# Patient Record
Sex: Male | Born: 1980 | Race: White | Hispanic: No | Marital: Married | Smoking: Never smoker
Health system: Southern US, Community
[De-identification: ages and names within clinical notes are randomized; demographics above are authoritative.]

## PROBLEM LIST (undated history)

## (undated) DIAGNOSIS — F419 Anxiety disorder, unspecified: Secondary | ICD-10-CM

## (undated) DIAGNOSIS — M25519 Pain in unspecified shoulder: Secondary | ICD-10-CM

## (undated) DIAGNOSIS — M1990 Unspecified osteoarthritis, unspecified site: Secondary | ICD-10-CM

## (undated) DIAGNOSIS — T7840XA Allergy, unspecified, initial encounter: Secondary | ICD-10-CM

## (undated) DIAGNOSIS — G43909 Migraine, unspecified, not intractable, without status migrainosus: Secondary | ICD-10-CM

## (undated) DIAGNOSIS — K635 Polyp of colon: Secondary | ICD-10-CM

## (undated) DIAGNOSIS — R002 Palpitations: Secondary | ICD-10-CM

## (undated) HISTORY — DX: Unspecified osteoarthritis, unspecified site: M19.90

## (undated) HISTORY — DX: Allergy, unspecified, initial encounter: T78.40XA

## (undated) HISTORY — DX: Pain in unspecified shoulder: M25.519

## (undated) HISTORY — DX: Migraine, unspecified, not intractable, without status migrainosus: G43.909

## (undated) HISTORY — DX: Palpitations: R00.2

## (undated) HISTORY — DX: Anxiety disorder, unspecified: F41.9

## (undated) HISTORY — PX: UPPER GASTROINTESTINAL ENDOSCOPY: SHX188

## (undated) HISTORY — DX: Polyp of colon: K63.5

## (undated) HISTORY — PX: COLONOSCOPY: SHX174

---

## 2014-11-17 DIAGNOSIS — G8929 Other chronic pain: Secondary | ICD-10-CM | POA: Insufficient documentation

## 2014-11-17 DIAGNOSIS — R519 Headache, unspecified: Secondary | ICD-10-CM | POA: Insufficient documentation

## 2014-11-23 DIAGNOSIS — G8929 Other chronic pain: Secondary | ICD-10-CM | POA: Insufficient documentation

## 2014-11-23 DIAGNOSIS — M25512 Pain in left shoulder: Secondary | ICD-10-CM

## 2014-11-23 DIAGNOSIS — M19011 Primary osteoarthritis, right shoulder: Secondary | ICD-10-CM | POA: Insufficient documentation

## 2014-11-23 DIAGNOSIS — M19012 Primary osteoarthritis, left shoulder: Secondary | ICD-10-CM | POA: Insufficient documentation

## 2014-11-23 HISTORY — DX: Pain in left shoulder: M25.512

## 2014-11-23 HISTORY — DX: Other chronic pain: G89.29

## 2015-12-13 DIAGNOSIS — R9389 Abnormal findings on diagnostic imaging of other specified body structures: Secondary | ICD-10-CM

## 2015-12-13 HISTORY — DX: Abnormal findings on diagnostic imaging of other specified body structures: R93.89

## 2019-10-31 ENCOUNTER — Other Ambulatory Visit: Payer: Self-pay | Admitting: *Deleted

## 2019-10-31 ENCOUNTER — Other Ambulatory Visit: Payer: 59

## 2019-10-31 DIAGNOSIS — Z20822 Contact with and (suspected) exposure to covid-19: Secondary | ICD-10-CM

## 2019-11-03 LAB — NOVEL CORONAVIRUS, NAA: SARS-CoV-2, NAA: NOT DETECTED

## 2019-11-12 ENCOUNTER — Other Ambulatory Visit: Payer: 59

## 2019-11-12 DIAGNOSIS — Z20822 Contact with and (suspected) exposure to covid-19: Secondary | ICD-10-CM

## 2019-11-13 LAB — NOVEL CORONAVIRUS, NAA: SARS-CoV-2, NAA: NOT DETECTED

## 2019-11-13 LAB — SARS-COV-2, NAA 2 DAY TAT

## 2019-12-01 ENCOUNTER — Other Ambulatory Visit: Payer: Self-pay

## 2019-12-01 ENCOUNTER — Ambulatory Visit: Payer: Managed Care, Other (non HMO) | Admitting: Family

## 2019-12-01 ENCOUNTER — Encounter: Payer: Self-pay | Admitting: Family

## 2019-12-01 VITALS — BP 145/92 | HR 73 | Temp 98.3°F | Ht 69.5 in | Wt 200.6 lb

## 2019-12-01 DIAGNOSIS — R198 Other specified symptoms and signs involving the digestive system and abdomen: Secondary | ICD-10-CM

## 2019-12-01 DIAGNOSIS — R519 Headache, unspecified: Secondary | ICD-10-CM

## 2019-12-01 DIAGNOSIS — Z8619 Personal history of other infectious and parasitic diseases: Secondary | ICD-10-CM

## 2019-12-01 DIAGNOSIS — Z86010 Personal history of colonic polyps: Secondary | ICD-10-CM

## 2019-12-01 DIAGNOSIS — M79661 Pain in right lower leg: Secondary | ICD-10-CM

## 2019-12-01 DIAGNOSIS — D126 Benign neoplasm of colon, unspecified: Secondary | ICD-10-CM | POA: Insufficient documentation

## 2019-12-01 NOTE — Progress Notes (Signed)
Bradley Lawson is a 39 y.o. male with the following history as recorded in Epic[employer]:  Patient Active Problem List   Diagnosis Date Noted  . History of Clostridioides difficile infection 12/01/2019  . Adenomatous colon polyp 12/01/2019  . Chronic nonintractable headache 11/17/2014    Current Outpatient Medications  Medication Sig Dispense Refill  . Cholecalciferol 50 MCG (2000 UT) CAPS Take by mouth.    . loratadine (CLARITIN) 10 MG tablet Take by mouth.    . propranolol ER (INDERAL LA) 160 MG SR capsule Take 1 capsule by mouth at bedtime.    Marland Kitchen zolpidem (AMBIEN) 5 MG tablet Take by mouth.     No current facility-administered medications for this visit.    Allergies: Clindamycin/lincomycin  Past Medical History:  Diagnosis Date  . Migraine headache     History reviewed. No pertinent surgical history.  History reviewed. No pertinent family history.  Social History   Tobacco Use  . Smoking status: Not on file  Substance Use Topics  . Alcohol use: Not on file    Subjective:  Presents today as a new patient; is up to date on CPE- had with provider in [place] earlier this year; recently moved from [place];  Is recovering from right calf strain- taking anywhere from 2-6 tablets Ibuprofen; would like to get ultrasound updated; concerned he may have torn something; Chronic headaches- has been under care of [facility]; would be open to seeing provider in [place];      Objective:  Vitals:   12/01/19 0859  BP: (!) 145/92  Pulse: 73  Temp: 98.3 F (36.8 C)  TempSrc: Oral  SpO2: 98%  Weight: 200 lb 9.6 oz (91 kg)  Height: 5' 9.5" (1.765 m)    General: Well developed, well nourished, in no acute distress  Skin : Warm and dry.  Head: Normocephalic and atraumatic  Eyes: Sclera and conjunctiva clear; pupils round and reactive to light; extraocular movements intact  Ears: External normal; canals clear; tympanic membranes normal  Oropharynx: Pink, supple. No suspicious  lesions  Neck: Supple without thyromegaly, adenopathy  Lungs: Respirations unlabored; clear to auscultation bilaterally without wheeze, rales, rhonchi  CVS exam: normal rate and regular rhythm.  Abdomen: Soft; nontender; nondistended; normoactive bowel sounds; no masses or hepatosplenomegaly  Musculoskeletal: No deformities; no active joint inflammation; mild swelling noted in right calf Extremities: No edema, cyanosis, clubbing  Vessels: Symmetric bilaterally  Neurologic: Alert and oriented; speech intact; face symmetrical; moves all extremities well; CNII-XII intact without focal deficit   Assessment:  1. Change in bowel movement   2. History of Clostridioides difficile infection   3. History of adenomatous polyp of colon   4. Chronic daily headache     Plan:  Refer to GI and neurology to establish care with provider in [place]; He will schedule with sports medicine today regarding right calf pain;  This visit occurred during the SARS-CoV-2 public health emergency.  Safety protocols were in place, including screening questions prior to the visit, additional usage of staff PPE, and extensive cleaning of exam room while observing appropriate contact time as indicated for disinfecting solutions.   Spent 30 minutes with patient reviewing history/ discussing treatment plans;   Return for CPE after 04/02/2019.  Orders Placed This Encounter  Procedures  . Ambulatory referral to Gastroenterology    Referral Priority:   Routine    Referral Type:   Consultation    Referral Reason:   Specialty Services Required    Number of Visits Requested:  1  . Ambulatory referral to Neurology    Referral Priority:   Routine    Referral Type:   Consultation    Referral Reason:   Specialty Services Required    Referred to Provider:   Pieter Partridge, DO    Requested Specialty:   Neurology    Number of Visits Requested:   1    Requested Prescriptions    No prescriptions requested or ordered in this  encounter

## 2019-12-03 ENCOUNTER — Other Ambulatory Visit: Payer: Self-pay

## 2019-12-03 ENCOUNTER — Encounter: Payer: Self-pay | Admitting: Family Medicine

## 2019-12-03 ENCOUNTER — Ambulatory Visit: Payer: Managed Care, Other (non HMO) | Admitting: Family Medicine

## 2019-12-03 ENCOUNTER — Ambulatory Visit: Payer: Self-pay

## 2019-12-03 VITALS — BP 120/78 | HR 63 | Ht 69.5 in | Wt 202.4 lb

## 2019-12-03 DIAGNOSIS — S86111A Strain of other muscle(s) and tendon(s) of posterior muscle group at lower leg level, right leg, initial encounter: Secondary | ICD-10-CM | POA: Diagnosis not present

## 2019-12-03 DIAGNOSIS — T792XXA Traumatic secondary and recurrent hemorrhage and seroma, initial encounter: Secondary | ICD-10-CM

## 2019-12-03 DIAGNOSIS — M79661 Pain in right lower leg: Secondary | ICD-10-CM | POA: Diagnosis not present

## 2019-12-03 NOTE — Patient Instructions (Signed)
Thank you for coming in today.  Please use voltaren gel up to 4x daily for pain as needed.   I recommend you otained a compression sleeve to help with your joint problems. There are many options on the market however I recommend obtaining a full calf Body Helix compression sleeve.  You can find information (including how to appropriate measure yourself for sizing) can be found at www.Body http://www.lambert.com/.  Many of these products are health savings account (HSA) eligible.   You can use the compression sleeve at any time throughout the day but is most important to use while being active as well as for 2 hours post-activity.   It is appropriate to ice following activity with the compression sleeve in place.  Do the exercises we discussed.  Go from up to down slowly.   Recheck in 3 weeks.   If not better we can drain the fluid.    Seroma A seroma is a collection of fluid on the body that looks like swelling or a mass. Seromas form where tissue has been injured or cut. Seromas vary in size. Some are small and painless. Others may become large and cause pain or discomfort. Many seromas go away on their own as the fluid is naturally absorbed by the body, and some seromas need to be drained. What are the causes? Seromas form as the result of damage to tissue or the removal of tissue. This tissue damage may occur during surgery or because of an injury or trauma. When tissue is disrupted or removed, empty space is created. The body's natural defense system (immune system) causes fluid to enter the empty space and form a seroma. What are the signs or symptoms? Symptoms of this condition include:  Swelling at the site of a surgical cut (incision) or an injury.  Drainage of clear fluid at the surgery or injury site.  Discomfort or pain. How is this diagnosed? This condition is diagnosed based on your symptoms, your medical history, and a physical exam. During the exam, your health care provider will press  on the seroma. You may also have tests, including:  Blood tests.  Imaging tests, such as an ultrasound or CT scan. How is this treated? Some seromas go away (resolve) on their own. Your health care provider may monitor you to make sure the seroma does not cause any complications. If your seroma does not resolve on its own, treatment may include:  Using a needle to drain the fluid from the seroma (needle aspiration).  Inserting a flexible tube (catheter) to drain the fluid.  Applying a bandage (dressing), such as an elastic bandage or binder.  Antibiotic medicines, if the seroma becomes infected. In rare cases, surgery may be done to remove the seroma and repair the area. Follow these instructions at home:   If you were prescribed an antibiotic medicine, take it as told by your health care provider. Do not stop taking the antibiotic even if you start to feel better.  Return to your normal activities as told by your health care provider. Ask your health care provider what activities are safe for you.  Take over-the-counter and prescription medicines only as told by your health care provider.  Check your seroma every day for signs of infection. Check for: ? Redness or pain. ? Fluid or pus. ? More swelling. ? Warmth.  Keep all follow-up visits as told by your health care provider. This is important. Contact a health care provider if:  You have a  fever.  You have redness or pain at the site of the seroma.  You have fluid or pus coming from the seroma.  Your seroma is more swollen or is getting bigger.  Your seroma is warm to the touch. This information is not intended to replace advice given to you by your health care provider. Make sure you discuss any questions you have with your health care provider. Document Revised: 01/12/2017 Document Reviewed: 11/12/2015 Elsevier Patient Education  2020 [employer].     Medial Head Gastrocnemius Tear Rehab Ask your health care  provider which exercises are safe for you. Do exercises exactly as told by your health care provider and adjust them as directed. It is normal to feel mild stretching, pulling, tightness, or discomfort as you do these exercises. Stop right away if you feel sudden pain or your pain gets worse. Do not begin these exercises until told by your health care provider. Stretching and range-of-motion exercises These exercises warm up your muscles and joints and improve the movement and flexibility of your lower leg. These exercises also help to relieve pain and stiffness. Gastrocnemius stretch This exercise is also called a calf stretch. It stretches the muscles in the back of the lower leg (gastrocnemius). 1. Sit with your left / right leg extended. 2. Loop a belt or towel around the ball of your left / right foot. The ball of your foot is on the walking surface, right under your toes. 3. Hold both ends of the belt or towel. 4. Keep your left / right ankle and foot relaxed and keep your knee straight while you use the belt or towel to pull your foot and ankle toward you. Stop at the first point of resistance. 5. Hold this position for __________ seconds. Repeat __________ times. Complete this exercise __________ times a day. Ankle alphabet  1. Sit with your left / right leg supported at the lower leg. ? Do not rest your foot on anything. ? Make sure your foot has room to move freely. 2. Think of your left / right foot as a paintbrush. ? Move your foot to trace each letter of the alphabet in the air. Keep your hip and knee still while you trace. ? Make the letters as large as you can without feeling discomfort. 3. Trace every letter of the alphabet. Repeat __________ times. Complete this exercise __________ times a day. Strengthening exercises These exercises build strength and endurance in your lower leg. Endurance is the ability to use your muscles for a long time, even after they get tired. Plantar  flexion with band, seated  1. Sit on the floor with your left / right leg extended. 2. Loop a rubber exercise band or tube around the ball of your left / right foot. The ball of your foot is on the walking surface, right under your toes. The band or tube should be slightly tense when your foot is relaxed. If the band or tube slips, you can put on your shoe or put a washcloth between the band and your foot to help it stay in place. 3. While holding both ends of the band or tube, slowly point your toes downward, pushing them away from you (plantar flexion). 4. Hold this position for __________ seconds. 5. Slowly release the tension in the band or tube, controlling smoothly until your foot is back to the starting position. Repeat __________ times. Complete this exercise __________ times a day. Plantar flexion, standing  1. Stand with your feet  shoulder-width apart. 2. Place your hands on a wall or table to steady yourself as needed, but try not to use it for support. 3. Rise up on your toes (plantar flexion). 4. If this exercise is too easy, try these options: ? Shift your weight toward your left / right leg until you feel challenged. ? If told by your health care provider, stand on your left / right foot only. 5. Hold this position for __________ seconds. Repeat __________ times. Complete this exercise __________ times a day. Eccentric plantar flexion  1. Stand on the balls of your feet on the edge of a step. The ball of your foot is on the walking surface, right under your toes. ? Do not put your heels on the step. ? For balance, rest your hands on the wall or on a railing. Try not to lean on it for support. 2. Rise up onto the balls of your feet, using both legs to help. 3. Keeping your heels up, slowly shift all of your weight to your left / right foot and lift your other foot off the step. 4. Slowly lower your left / right heel so it drops below the level of the step. Lowering your heel  under tension is called eccentric plantar flexion. You will feel a slight stretch in your left / right calf. 5. Put your other foot back onto the step before returning to the start position. Repeat __________ times. Complete this exercise __________ times a day. This information is not intended to replace advice given to you by your health care provider. Make sure you discuss any questions you have with your health care provider. Document Revised: 11/27/2018 Document Reviewed: 12/10/2017 Elsevier Patient Education  2020 [employer].

## 2019-12-03 NOTE — Progress Notes (Signed)
   Subjective:    I'm seeing this patient as a consultation for:  Jodi Mourning, FNP. Note will be routed back to referring provider/PCP.  CC: R calf strain  I, Molly Weber, LAT, ATC, am serving as scribe for Dr. Lynne Leader.  HPI: Pt is a 39 y/o male presenting w/ c/o R calf pain x approximately one month when he was backpedaling while playing tennis.  He states that he felt like he was hit in the calf w/ a baseball bat.  He was improving until last week when he tripped and stepped awkwardly while he was hiking.  He locates his pain to his R medial gastroc.  R calf swelling: yes Aggravating factors: walking; standing straight up w/ his R foot flat on the floor; R ankle DF AROM Treatments tried: IBU;   Past medical history, Surgical history, Family history, Social history, Allergies, and medications have been entered into the medical record, reviewed.   Review of Systems: No new headache, visual changes, nausea, vomiting, diarrhea, constipation, dizziness, abdominal pain, skin rash, fevers, chills, night sweats, weight loss, swollen lymph nodes, body aches, joint swelling, muscle aches, chest pain, shortness of breath, mood changes, visual or auditory hallucinations.   Objective:    Vitals:   12/03/19 1312  BP: 120/78  Pulse: 63  SpO2: 97%   General: Well Developed, well nourished, and in no acute distress.  Neuro/Psych: Alert and oriented x3, extra-ocular muscles intact, able to move all 4 extremities, sensation grossly intact. Skin: Warm and dry, no rashes noted.  Respiratory: Not using accessory muscles, speaking in full sentences, trachea midline.  Cardiovascular: Pulses palpable, no extremity edema. Abdomen: Does not appear distended. MSK: Right calf large medial gastrocnemius otherwise normal. Normal foot and ankle motion and knee motion. Tender palpation medial head gastrocnemius. Achilles tendon nontender with no defects. Pulses capillary fill and sensation intact  distally.  Antalgic gait.  Lab and Radiology Results Diagnostic Limited MSK Ultrasound of: Right calf Achilles tendon intact normal-appearing Medial head gastrocnemius abnormal appearing with large hypoechoic structure deep to gastrocnemius superficial to the soleus consistent with seroma. Minimal retraction muscle tendinous junction medial gastrocnemius. Impression: Medial head gastrocnemius muscle tear with seroma/hematoma   Impression and Recommendations:    Assessment and Plan: 39 y.o. male with right medial head gastrocnemius tear occurring about a month ago with repeat injury recently.  Patient also has a large seroma or hematoma deep to the gastrocnemius at the medial head.  Discussed treatment plans and options.  Plan for Voltaren gel and eccentric exercises and compression.  Recheck back in 3 weeks or so.  Will reassess seroma at that point and if not better consider aspiration and injection.  Patient notes headache history and would like to avoid nitroglycerin patches if possible.Marland Kitchen  PDMP not reviewed this encounter. Orders Placed This Encounter  Procedures  . [place] LIMITED JOINT SPACE STRUCTURES LOW RIGHT(NO LINKED CHARGES)    Order Specific Question:   Reason for Exam (SYMPTOM  OR DIAGNOSIS REQUIRED)    Answer:   R calf pain    Order Specific Question:   Preferred imaging location?    Answer:   [place]   No orders of the defined types were placed in this encounter.   Discussed warning signs or symptoms. Please see discharge instructions. Patient expresses understanding.   The above documentation has been reviewed and is accurate and complete Lynne Leader, M.D.

## 2019-12-15 ENCOUNTER — Encounter: Payer: Self-pay | Admitting: Neurology

## 2019-12-15 ENCOUNTER — Other Ambulatory Visit: Payer: Self-pay | Admitting: Family

## 2019-12-15 DIAGNOSIS — G8929 Other chronic pain: Secondary | ICD-10-CM

## 2019-12-15 DIAGNOSIS — R519 Headache, unspecified: Secondary | ICD-10-CM

## 2019-12-23 NOTE — Progress Notes (Signed)
   I, Wendy Poet, LAT, ATC, am serving as scribe for Dr. Lynne Leader.  Bradley Lawson is a 39 y.o. male who presents to [place] at [facility] today for f/u of R medial gastroc pain.  He was last seen by Dr. Georgina Snell on 12/03/19 and he was advised to use a calf compression sleeve and was shown a HEP consisting of Alfredson's exercises by Dr. Georgina Snell.  Since his last visit, pt reports that his R medial gastroc is improving to the point that he was able to do some limited jogging w/ good results.  He notes that his biggest issue is w/ calf tightness in the morning when he first gets out of bed.  He has been intermittently wearing a calf compression sleeve and doing his Alfredson's exercises approximately every other day.   Pertinent review of systems: No fevers or chills  Relevant historical information: History of colon polyp and C. difficile infection   Exam:  BP 102/72 (BP Location: Left Arm, Patient Position: Sitting, Cuff Size: Large)   Pulse (!) 53   Ht 5' 9.5" (1.765 m)   Wt 204 lb 6.4 oz (92.7 kg)   SpO2 98%   BMI 29.75 kg/m  General: Well Developed, well nourished, and in no acute distress.   MSK: Right calf normal-appearing nontender normal motion normal strength.    Lab and Radiology Results  Diagnostic Limited MSK Ultrasound of: Right gastrocnemius medial head Small seroma still visible deep to distal medial head gastrocnemius near muscle tendinous junction. It measures 1 x 5 x 3 cm Impression: Small resolving seroma     Assessment and Plan: 39 y.o. male with resolving seroma right medial gastrocnemius.  Clinically improving as well.  Discussed options.  Plan to give this a bit more time.  Emphasized the importance of compression.  Recheck in a month if still significantly present we will proceed with aspiration and injection however I am optimistic this will improve on its own.   PDMP not reviewed this encounter. Orders Placed This Encounter    Procedures  . [place] LIMITED JOINT SPACE STRUCTURES LOW RIGHT(NO LINKED CHARGES)    Order Specific Question:   Reason for Exam (SYMPTOM  OR DIAGNOSIS REQUIRED)    Answer:   R gastroc strain    Order Specific Question:   Preferred imaging location?    Answer:   [place]   No orders of the defined types were placed in this encounter.    Discussed warning signs or symptoms. Please see discharge instructions. Patient expresses understanding.   The above documentation has been reviewed and is accurate and complete Lynne Leader, M.D.

## 2019-12-24 ENCOUNTER — Other Ambulatory Visit: Payer: Self-pay

## 2019-12-24 ENCOUNTER — Ambulatory Visit: Payer: Self-pay

## 2019-12-24 ENCOUNTER — Encounter: Payer: Self-pay | Admitting: Family Medicine

## 2019-12-24 ENCOUNTER — Ambulatory Visit: Payer: Managed Care, Other (non HMO) | Admitting: Family Medicine

## 2019-12-24 VITALS — BP 102/72 | HR 53 | Ht 69.5 in | Wt 204.4 lb

## 2019-12-24 DIAGNOSIS — M79661 Pain in right lower leg: Secondary | ICD-10-CM

## 2019-12-24 DIAGNOSIS — T792XXD Traumatic secondary and recurrent hemorrhage and seroma, subsequent encounter: Secondary | ICD-10-CM

## 2019-12-24 NOTE — Patient Instructions (Signed)
Thank you for coming in today.  It is improving both clinically and on ultrasound.   Use the compression as much as possible.   Ok to advance activity as tolerated.   Recheck in a month.  If not significantly better will aspirate the fluid.

## 2020-01-30 NOTE — Progress Notes (Signed)
   I, Bradley Lawson, LAT, ATC, am serving as scribe for Dr. Lynne Leader.  Bradley Lawson is a 39 y.o. male who presents to [place] at [facility] today for f/u of R medial calf/gastroc pain.  He was last seen by Dr. Georgina Snell on 12/24/19 for f/u and noted overall improvement w/ his symptoms to the point that he had resumed limited jogging.  He was advised to con't using/wearing his calf compression sleeve and to con't his Alfredson's exercise as well as to advance activity as tolerated.  Since his last visit, pt reports improvement in calf pain. Pt has been jogging intermittently. Pt notes an improvement in anxiety related to the injury. Pt states that there is one area of tenderness towards the distal junction of the gastroc/soleus complex.   Pertinent review of systems: No fevers or chills  Relevant historical information: Migraine headaches   Exam:  BP 118/80   Pulse 69   Ht 5' 9.5" (1.765 m)   Wt 209 lb (94.8 kg)   SpO2 98%   BMI 30.42 kg/m  General: Well Developed, well nourished, and in no acute distress.   MSK: Right calf large but otherwise normal-appearing.  No swelling or bruising. Normal foot and ankle motion. Mildly tender at medial gastrocnemius muscle tendinous junction. Strength is intact.     Lab and Radiology Results  Diagnostic Limited MSK Ultrasound of: Right gastrocnemius medial head Continued visible disruption of muscle fibers of muscle tendinous junction however no significant remaining seroma.  Scar tissue formation with linear parallel lines visible indicating good healing response.  Not much vascular activity seen on Doppler indicating good healing as well. Impression: Healing gastrocnemius tear     Assessment and Plan: 39 y.o. male with right gastrocnemius tear occurring in September.  Significantly improving with conservative management.  Plan to continue compression and eccentric exercises.  He normally would be a good candidate  for nitroglycerin patch protocol but he has a history of headache disorder and is currently on propranolol daily for prophylaxis.  I think at this point the risk of propranolol would exceed the benefit. Recheck back as needed.   PDMP not reviewed this encounter. Orders Placed This Encounter  Procedures  . [place] LIMITED JOINT SPACE STRUCTURES LOW RIGHT(NO LINKED CHARGES)    Standing Status:   Future    Number of Occurrences:   1    Standing Expiration Date:   08/02/2020    Order Specific Question:   Reason for Exam (SYMPTOM  OR DIAGNOSIS REQUIRED)    Answer:   right knee pain    Order Specific Question:   Preferred imaging location?    Answer:   [place]   No orders of the defined types were placed in this encounter.    Discussed warning signs or symptoms. Please see discharge instructions. Patient expresses understanding.   The above documentation has been reviewed and is accurate and complete Lynne Leader, M.D.

## 2020-02-02 ENCOUNTER — Ambulatory Visit: Payer: Managed Care, Other (non HMO) | Admitting: Family Medicine

## 2020-02-02 ENCOUNTER — Ambulatory Visit: Payer: Self-pay

## 2020-02-02 ENCOUNTER — Other Ambulatory Visit: Payer: Self-pay

## 2020-02-02 VITALS — BP 118/80 | HR 69 | Ht 69.5 in | Wt 209.0 lb

## 2020-02-02 DIAGNOSIS — S86111D Strain of other muscle(s) and tendon(s) of posterior muscle group at lower leg level, right leg, subsequent encounter: Secondary | ICD-10-CM

## 2020-02-02 DIAGNOSIS — M79661 Pain in right lower leg: Secondary | ICD-10-CM

## 2020-02-02 NOTE — Patient Instructions (Signed)
Thank you for coming in today.   Plan to continue to advance exercise as tolerated.   Continue the home exercises and compression.   If not feeling better let me know.  We may want to try the nitro patches then even with your headache history.   Avoid blastic activity for another month or so.

## 2020-02-16 ENCOUNTER — Ambulatory Visit: Payer: Managed Care, Other (non HMO) | Admitting: Gastroenterology

## 2020-02-25 NOTE — Progress Notes (Signed)
NEUROLOGY CONSULTATION NOTE  Bradley Lawson MRN: [id number] DOB: Dec 18, 1980  Referring provider: Marrian Lawson, [place] Primary care provider: Marrian Salvage, FNP  Reason for consult:  headaches   Subjective:  Bradley Lawson is a 40 year old right-handed male who presents for headaches.  History supplemented by prior neurologist's and referring provider's notes.  He reports history of migraines since childhood.  He has been on propranolol for many years but recently doesn't think it has been effective.  They are moderate bifrontal pressure ache.  Some photophobia and phonophobia.  No nausea, vomiting, visual disturbance, numbness and weakness. They last 3-4 hours.  They occur every other day.  Takes ibuprofen 2 to 3 tablets almost daily.  Triggers include noise, certain smells (perfumes, flowers), stress and sometimes exertion (lifting weights).  Applying pressure on the temples help relieve it.    Previous imaging: 01/23/2013 MRI BRAIN W WO:  Normal study 06/11/2017 MRI BRAIN/PITUITARY W WO:  Normal MRI of the brain and pituitary  Current NSAIDS/analgesics:  ibuprofen Current triptans:  none Current ergotamine:  none Current anti-emetic:  none Current muscle relaxants:  none Current Antihypertensive medications:  Propranolol ER 160mg  Current Antidepressant medications:  none Current Anticonvulsant medications:  none Current anti-CGRP:  none Current Vitamins/Herbal/Supplements:  none Current Antihistamines/Decongestants:  Claritin Other therapy:  none Hormone/birth control:  none Other medications:  Ambien  Past NSAIDS/analgesics:  Tylenol, Excedrin Past abortive triptans:  Sumatriptan, Zomig-MLT Past abortive ergotamine:  none Past muscle relaxants:  none Past anti-emetic:  none Past antihypertensive medications:  none Past antidepressant medications:  none Past anticonvulsant medications:  topiramate (worsened tinnitus), zonisamide Past anti-CGRP:   none Past vitamins/Herbal/Supplements:  none Past antihistamines/decongestants:  Flonase Other past therapies:  none  Caffeine:  2 cups of coffee daily.  1 can Coke Zero daily Diet:  36 oz water daily.  Does not skip meals Exercise:  Routine.  Lifts weights Depression:  no; Anxiety:  A little bit Other pain:  no Sleep:  good Family history of headache:  Father, daughter   PAST MEDICAL HISTORY: Past Medical History:  Diagnosis Date  . Colon polyps   . Migraine headache   . Osteoarthritis    shoulder  . Shoulder pain    left    PAST SURGICAL HISTORY: No past surgical history on file.  MEDICATIONS: Current Outpatient Medications on File Prior to Visit  Medication Sig Dispense Refill  . Cholecalciferol 50 MCG (2000 UT) CAPS Take by mouth.    . loratadine (CLARITIN) 10 MG tablet Take by mouth.    . propranolol ER (INDERAL LA) 160 MG SR capsule Take 1 capsule by mouth at bedtime.    Marland Kitchen zolpidem (AMBIEN) 5 MG tablet Take by mouth.     No current facility-administered medications on file prior to visit.    ALLERGIES: Allergies  Allergen Reactions  . Clindamycin/Lincomycin   . Sulfa Antibiotics     FAMILY HISTORY: Family History  Problem Relation Age of Onset  . Irritable bowel syndrome Mother   . Anemia Mother   . Diverticulosis Father   . Irritable bowel syndrome Brother   . Stomach cancer Maternal Grandfather   . Heart disease Maternal Grandfather   . Heart disease Paternal Grandmother        CAD    SOCIAL HISTORY: Social History   Socioeconomic History  . Marital status: Married    Spouse name: Not on file  . Number of children: Not on file  . Years of education: Not  on file  . Highest education level: Not on file  Occupational History  . Not on file  Tobacco Use  . Smoking status: Never Smoker  . Smokeless tobacco: Never Used  Substance and Sexual Activity  . Alcohol use: Not on file    Comment: 2-3 on weekends  . Drug use: Never  . Sexual  activity: Yes    Partners: Female  Other Topics Concern  . Not on file  Social History Narrative  . Not on file   Social Determinants of Health   Financial Resource Strain: Not on file  Food Insecurity: Not on file  Transportation Needs: Not on file  Physical Activity: Not on file  Stress: Not on file  Social Connections: Not on file  Intimate Partner Violence: Not on file    Objective:  Blood pressure 130/74, pulse 67, resp. rate 20, height 5' 9.5" (1.765 m), weight 212 lb (96.2 kg), SpO2 99 %. General: No acute distress.  Patient appears well-groomed.   Head:  Normocephalic/atraumatic Eyes:  fundi examined but not visualized Neck: supple, no paraspinal tenderness, full range of motion Back: No paraspinal tenderness Heart: regular rate and rhythm Lungs: Clear to auscultation bilaterally. Vascular: No carotid bruits. Neurological Exam: Mental status: alert and oriented to person, place, and time, recent and remote memory intact, fund of knowledge intact, attention and concentration intact, speech fluent and not dysarthric, language intact. Cranial nerves: CN I: not tested CN II: pupils equal, round and reactive to light, visual fields intact CN III, IV, VI:  full range of motion, no nystagmus, no ptosis CN V: facial sensation intact. CN VII: upper and lower face symmetric CN VIII: hearing intact CN IX, X: gag intact, uvula midline CN XI: sternocleidomastoid and trapezius muscles intact CN XII: tongue midline Bulk & Tone: normal, no fasciculations. Motor:  muscle strength 5/5 throughout Sensation:  Pinprick, temperature and vibratory sensation intact. Deep Tendon Reflexes:  2+ throughout,  toes downgoing.   Finger to nose testing:  Without dysmetria.   Heel to shin:  Without dysmetria.   Gait:  Normal station and stride.  Romberg negative.  Assessment/Plan:   Probable chronic migraine without aura, without status migrainosus, not intractable, complicated by  medication-overuse  1.  Migraine prevention:  Taper off of propranolol.  Start Qulipta 60mg  daily 2.  Migraine rescue:  Stop ibuprofen.  Start Ubrelvy 100mg  3.  Limit use of pain relievers to no more than 2 days out of week to prevent risk of rebound or medication-overuse headache. 4.  Keep headache diary 5.  Caffeine cessation 6.  Follow up in 6 months.    Thank you for allowing me to take part in the care of this patient.  Metta Clines, DO  CC:  Bradley Lawson, [place]

## 2020-02-26 ENCOUNTER — Encounter: Payer: Self-pay | Admitting: Neurology

## 2020-02-26 ENCOUNTER — Ambulatory Visit (INDEPENDENT_AMBULATORY_CARE_PROVIDER_SITE_OTHER): Payer: Managed Care, Other (non HMO) | Admitting: Neurology

## 2020-02-26 ENCOUNTER — Other Ambulatory Visit: Payer: Self-pay

## 2020-02-26 VITALS — BP 130/74 | HR 67 | Resp 20 | Ht 69.5 in | Wt 212.0 lb

## 2020-02-26 DIAGNOSIS — G43709 Chronic migraine without aura, not intractable, without status migrainosus: Secondary | ICD-10-CM

## 2020-02-26 MED ORDER — UBRELVY 100 MG PO TABS: 1.0000 | ORAL_TABLET | ORAL | 5 refills | Status: DC | PRN

## 2020-02-26 MED ORDER — QULIPTA 60 MG PO TABS: 60.0000 mg | ORAL_TABLET | Freq: Every day | ORAL | 5 refills | Status: DC

## 2020-02-26 MED ORDER — PROPRANOLOL HCL ER 80 MG PO CP24: 80.0000 mg | ORAL_CAPSULE | Freq: Every day | ORAL | 0 refills | Status: DC

## 2020-02-26 NOTE — Patient Instructions (Signed)
  1. Take propranolol ER 80mg  daily for one week then stop 2. Start Qulipta 60mg  daily.   3. Stop ibuprofen 4. Take Ubrelvy 100mg  at earliest onset of headache.  May repeat dose once in 2 hours if needed.  Maximum 2 tablets in 24 hours. 5. Limit use of pain relievers to no more than 2 days out of the week.  These medications include acetaminophen, NSAIDs (ibuprofen/Advil/Motrin, naproxen/Aleve, triptans (Imitrex/sumatriptan), Excedrin, and narcotics.  This will help reduce risk of rebound headaches. 6. Be aware of common food triggers:  - Caffeine:  coffee, black tea, cola, Mt. Dew  - Chocolate  - Dairy:  aged cheeses (brie, blue, cheddar, gouda, [place], provolone, [place], Swiss, etc), chocolate milk, buttermilk, sour cream, limit eggs and yogurt  - Nuts, peanut butter  - Alcohol  - Cereals/grains:  FRESH breads (fresh bagels, sourdough, doughnuts), yeast productions  - Processed/canned/aged/cured meats (pre-packaged deli meats, hotdogs)  - MSG/glutamate:  soy sauce, flavor enhancer, pickled/preserved/marinated foods  - Sweeteners:  aspartame (Equal, Nutrasweet).  Sugar and Splenda are okay  - Vegetables:  legumes (lima beans, lentils, snow peas, fava beans, pinto peans, peas, garbanzo beans), sauerkraut, onions, olives, pickles  - Fruit:  avocados, bananas, citrus fruit (orange, lemon, grapefruit), mango  - Other:  Frozen meals, macaroni and cheese 7. Routine exercise 8. Stay adequately hydrated (aim for 64 oz water daily) 9. Keep headache diary 10. Maintain proper stress management 11. Maintain proper sleep hygiene 12. Do not skip meals 13. Consider supplements:  magnesium citrate 400mg  daily, riboflavin 400mg  daily, coenzyme Q10 100mg  three times daily.

## 2020-03-12 ENCOUNTER — Other Ambulatory Visit: Payer: Self-pay

## 2020-03-12 MED ORDER — PROPRANOLOL HCL 60 MG PO TABS: ORAL_TABLET | ORAL | 0 refills | Status: DC

## 2020-03-12 NOTE — Progress Notes (Signed)
Per DR.Jaffe, prescribe him propranolol 60mg  tablet (immediate release, NOT ER) and wean off more slowly: - take 1 tablet twice daily for one week - then 1/2 tablet in morning and 1 tablet at night for one week - then 1/2 tablet twice daily for one week - then stop.

## 2020-03-12 NOTE — Telephone Encounter (Signed)
I don't think it is the [place] but it may be propranolol withdrawal.  I would like to prescribe him propranolol 60mg  tablet (immediate release, NOT ER) and wean off more slowly: - take 1 tablet twice daily for one week - then 1/2 tablet in morning and 1 tablet at night for one week - then 1/2 tablet twice daily for one week - then stop.

## 2020-04-02 ENCOUNTER — Encounter: Payer: Managed Care, Other (non HMO) | Admitting: Family

## 2020-04-12 ENCOUNTER — Encounter: Payer: Self-pay | Admitting: Family

## 2020-04-12 ENCOUNTER — Ambulatory Visit (INDEPENDENT_AMBULATORY_CARE_PROVIDER_SITE_OTHER): Payer: Managed Care, Other (non HMO) | Admitting: Family

## 2020-04-12 ENCOUNTER — Other Ambulatory Visit: Payer: Self-pay

## 2020-04-12 VITALS — BP 132/80 | HR 79 | Temp 98.2°F | Ht 71.0 in | Wt 208.8 lb

## 2020-04-12 DIAGNOSIS — D126 Benign neoplasm of colon, unspecified: Secondary | ICD-10-CM | POA: Diagnosis not present

## 2020-04-12 DIAGNOSIS — Z0000 Encounter for general adult medical examination without abnormal findings: Secondary | ICD-10-CM | POA: Diagnosis not present

## 2020-04-12 DIAGNOSIS — Z13220 Encounter for screening for lipoid disorders: Secondary | ICD-10-CM | POA: Diagnosis not present

## 2020-04-12 DIAGNOSIS — Z8619 Personal history of other infectious and parasitic diseases: Secondary | ICD-10-CM

## 2020-04-12 LAB — COMPREHENSIVE METABOLIC PANEL
ALT: 25 U/L (ref 0–53)
AST: 20 U/L (ref 0–37)
Albumin: 4.5 g/dL (ref 3.5–5.2)
Alkaline Phosphatase: 41 U/L (ref 39–117)
BUN: 12 mg/dL (ref 6–23)
CO2: 27 mEq/L (ref 19–32)
Calcium: 9.5 mg/dL (ref 8.4–10.5)
Chloride: 103 mEq/L (ref 96–112)
Creatinine, Ser: 0.94 mg/dL (ref 0.40–1.50)
GFR: 103.53 mL/min (ref >60.00)
Glucose, Bld: 84 mg/dL (ref 70–99)
Potassium: 4 mEq/L (ref 3.5–5.1)
Sodium: 140 mEq/L (ref 135–145)
Total Bilirubin: 0.7 mg/dL (ref 0.2–1.2)
Total Protein: 7 g/dL (ref 6.0–8.3)

## 2020-04-12 LAB — CBC WITH DIFFERENTIAL/PLATELET
Basophils Absolute: 0 10*3/uL (ref 0.0–0.1)
Basophils Relative: 0.3 % (ref 0.0–3.0)
Eosinophils Absolute: 0.1 10*3/uL (ref 0.0–0.7)
Eosinophils Relative: 1.1 % (ref 0.0–5.0)
HCT: 44 % (ref 39.0–52.0)
Hemoglobin: 15.2 g/dL (ref 13.0–17.0)
Lymphocytes Relative: 31.8 % (ref 12.0–46.0)
Lymphs Abs: 2.2 10*3/uL (ref 0.7–4.0)
MCHC: 34.5 g/dL (ref 30.0–36.0)
MCV: 87.2 fl (ref 78.0–100.0)
Monocytes Absolute: 0.5 10*3/uL (ref 0.1–1.0)
Monocytes Relative: 6.9 % (ref 3.0–12.0)
Neutro Abs: 4.2 10*3/uL (ref 1.4–7.7)
Neutrophils Relative %: 59.9 % (ref 43.0–77.0)
Platelets: 217 10*3/uL (ref 150.0–400.0)
RBC: 5.05 Mil/uL (ref 4.22–5.81)
RDW: 12.6 % (ref 11.5–15.5)
WBC: 7.1 10*3/uL (ref 4.0–10.5)

## 2020-04-12 LAB — LIPID PANEL
Cholesterol: 227 mg/dL — ABNORMAL HIGH (ref 0–200)
HDL: 39.1 mg/dL (ref >39.00)
LDL Cholesterol: 162 mg/dL — ABNORMAL HIGH (ref 0–99)
NonHDL: 187.66
Total CHOL/HDL Ratio: 6
Triglycerides: 130 mg/dL (ref 0.0–149.0)
VLDL: 26 mg/dL (ref 0.0–40.0)

## 2020-04-12 NOTE — Addendum Note (Signed)
Addended by: Jacobo Forest on: 04/12/2020 09:40 AM   Modules accepted: Orders

## 2020-04-12 NOTE — Progress Notes (Signed)
Authur Cubit is a 40 y.o. male with the following history as recorded in [employer]:  Patient Active Problem List   Diagnosis Date Noted  . History of Clostridioides difficile infection 12/01/2019  . Adenomatous colon polyp 12/01/2019  . Chronic nonintractable headache 11/17/2014    Current Outpatient Medications  Medication Sig Dispense Refill  . Atogepant (QULIPTA) 60 MG TABS Take 60 mg by mouth daily. 30 tablet 5  . Cholecalciferol 50 MCG (2000 UT) CAPS Take by mouth.    . clindamycin (CLEOCIN T) 1 % external solution Apply topically.    Marland Kitchen doxycycline (VIBRA-TABS) 100 MG tablet Take 100 mg by mouth 2 (two) times daily.    Marland Kitchen loratadine (CLARITIN) 10 MG tablet Take by mouth.    . Ubrogepant (UBRELVY) 100 MG TABS Take 1 tablet by mouth as needed (May repeat in 2 hours.  Maximum 2 tablets in 24 hours.). 16 tablet 5  . zolpidem (AMBIEN) 5 MG tablet Take by mouth.    . propranolol (INDERAL) 60 MG tablet take 1 tablet twice daily for one week - then 1/2 tablet in morning and 1 tablet at night for one week - then 1/2 tablet twice daily for one week - then stop. (Patient not taking: Reported on 04/12/2020) 60 tablet 0   No current facility-administered medications for this visit.    Allergies: Clindamycin/lincomycin and Sulfa antibiotics  Past Medical History:  Diagnosis Date  . Colon polyps   . Migraine headache   . Osteoarthritis    shoulder  . Shoulder pain    left    History reviewed. No pertinent surgical history.  Family History  Problem Relation Age of Onset  . Irritable bowel syndrome Mother   . Anemia Mother   . Diverticulosis Father   . Irritable bowel syndrome Brother   . Stomach cancer Maternal Grandfather   . Heart disease Maternal Grandfather   . Heart disease Paternal Grandmother        CAD    Social History   Tobacco Use  . Smoking status: Never Smoker  . Smokeless tobacco: Never Used  Substance Use Topics  . Alcohol use: Not on file    Comment: 2-3 on  weekends    Subjective:   Presents for yearly CPE; in baseline state of health; Headaches are much improved; seeing dermatology regularly;  Goal weight is to get below 200- hoping that can get more exercise when springs start;   Review of Systems  Constitutional: Negative.   HENT: Negative.   Eyes: Negative.   Respiratory: Negative.   Cardiovascular: Negative.   Gastrointestinal: Negative.   Genitourinary: Negative.   Musculoskeletal: Negative.   Skin: Negative.   Neurological: Negative.   Endo/Heme/Allergies: Negative.   Psychiatric/Behavioral: Negative.      Objective:  Vitals:   04/12/20 0859  BP: 132/80  Pulse: 79  Temp: 98.2 F (36.8 C)  TempSrc: Oral  SpO2: 98%  Weight: 208 lb 12.8 oz (94.7 kg)  Height: _0  (1.803 m)    General: Well developed, well nourished, in no acute distress  Skin : Warm and dry.  Head: Normocephalic and atraumatic  Eyes: Sclera and conjunctiva clear; pupils round and reactive to light; extraocular movements intact  Ears: External normal; canals clear; tympanic membranes normal  Oropharynx: Pink, supple. No suspicious lesions  Neck: Supple without thyromegaly, adenopathy  Lungs: Respirations unlabored; clear to auscultation bilaterally without wheeze, rales, rhonchi  CVS exam: normal rate and regular rhythm.  Abdomen: Soft; nontender; nondistended; normoactive bowel  sounds; no masses or hepatosplenomegaly  Musculoskeletal: No deformities; no active joint inflammation  Extremities: No edema, cyanosis, clubbing  Vessels: Symmetric bilaterally  Neurologic: Alert and oriented; speech intact; face symmetrical; moves all extremities well; CNII-XII intact without focal deficit   Assessment:  1. PE (physical exam), annual   2. History of Clostridioides difficile infection   3. Adenomatous polyp of colon, unspecified part of colon   4. Lipid screening     Plan:   Age appropriate preventive healthcare needs addressed; encouraged regular  eye doctor and dental exams; encouraged regular exercise; will update labs and refills as needed today; follow-up to be determined;  This visit occurred during the SARS-CoV-2 public health emergency.  Safety protocols were in place, including screening questions prior to the visit, additional usage of staff PPE, and extensive cleaning of exam room while observing appropriate contact time as indicated for disinfecting solutions.     No follow-ups on file.  Orders Placed This Encounter  Procedures  . CBC with Differential/Platelet    Standing Status:   Future    Standing Expiration Date:   04/12/2021  . Comp Met (CMET)    Standing Status:   Future    Standing Expiration Date:   04/12/2021  . Lipid panel    Standing Status:   Future    Standing Expiration Date:   04/12/2021  . Ambulatory referral to Gastroenterology    Referral Priority:   Routine    Referral Type:   Consultation    Referral Reason:   Specialty Services Required    Number of Visits Requested:   1    Requested Prescriptions    No prescriptions requested or ordered in this encounter

## 2020-04-13 ENCOUNTER — Other Ambulatory Visit: Payer: Self-pay | Admitting: Family

## 2020-04-13 DIAGNOSIS — E785 Hyperlipidemia, unspecified: Secondary | ICD-10-CM

## 2020-06-30 ENCOUNTER — Other Ambulatory Visit (INDEPENDENT_AMBULATORY_CARE_PROVIDER_SITE_OTHER): Payer: Managed Care, Other (non HMO)

## 2020-06-30 ENCOUNTER — Ambulatory Visit (INDEPENDENT_AMBULATORY_CARE_PROVIDER_SITE_OTHER): Payer: Managed Care, Other (non HMO) | Admitting: Gastroenterology

## 2020-06-30 ENCOUNTER — Encounter: Payer: Self-pay | Admitting: Gastroenterology

## 2020-06-30 VITALS — BP 124/78 | HR 70 | Ht 69.5 in | Wt 205.4 lb

## 2020-06-30 DIAGNOSIS — Z8719 Personal history of other diseases of the digestive system: Secondary | ICD-10-CM | POA: Diagnosis not present

## 2020-06-30 DIAGNOSIS — Z8619 Personal history of other infectious and parasitic diseases: Secondary | ICD-10-CM

## 2020-06-30 DIAGNOSIS — Z86010 Personal history of colonic polyps: Secondary | ICD-10-CM | POA: Diagnosis not present

## 2020-06-30 DIAGNOSIS — R195 Other fecal abnormalities: Secondary | ICD-10-CM | POA: Diagnosis not present

## 2020-06-30 DIAGNOSIS — R197 Diarrhea, unspecified: Secondary | ICD-10-CM

## 2020-06-30 LAB — TSH: TSH: 1.33 u[IU]/mL (ref 0.35–4.50)

## 2020-06-30 MED ORDER — NA SULFATE-K SULFATE-MG SULF 17.5-3.13-1.6 GM/177ML PO SOLN: 1.0000 | Freq: Once | ORAL | 0 refills | Status: AC

## 2020-06-30 NOTE — Patient Instructions (Signed)
We have sent the following medications to your pharmacy for you to pick up at your convenience: Suprep  If you are age 38 or younger, your body mass index should be between 19-25. Your Body mass index is 29.89 kg/m. If this is out of the aformentioned range listed, please consider follow up with your Primary Care Provider.   Your provider has requested that you go to the basement level for lab work before leaving today. Press "B" on the elevator. The lab is located at the first door on the left as you exit the elevator.  Due to recent changes in healthcare laws, you may see the results of your imaging and laboratory studies on MyChart before your provider has had a chance to review them.  We understand that in some cases there may be results that are confusing or concerning to you. Not all laboratory results come back in the same time frame and the provider may be waiting for multiple results in order to interpret others.  Please give [place] 48 hours in order for your provider to thoroughly review all the results before contacting the office for clarification of your results.   Thank you for choosing me and [facility].  Dr. Rush Landmark

## 2020-06-30 NOTE — Progress Notes (Signed)
GASTROENTEROLOGY OUTPATIENT CLINIC VISIT   Primary Care Provider Olive Bass, FNP [address] [place] [address] [phone]  Referring Provider Olive Bass, FNP [address] [place],  [place] [address] [phone]  Patient Profile: Bradley Lawson is a 40 y.o. male with a pmh significant for osteoarthritis, colon polyps (TA & SSPs), diverticulosis, prior C. difficile infection, prior esophageal ulcers (query pill induced), chronic loose bowel movements.  The patient presents to the [facility] for an evaluation and management of problem(s) noted below:  Problem List 1. Loose bowel movements   2. Hx of adenomatous colonic polyps   3. History of esophageal ulcer   4. History of Clostridioides difficile infection     History of Present Illness This is the patient's first visit to the outpatient [facility].  The patient previously lived in [place].  Patient developed C. difficile infection in 2018.  Eventually was treated with eradication of C. difficile.  Unfortunately, it seems like the patient had a change in his microbiome because he began to experience more significant alteration of his bowel habits from prior.  Patient years previously had 1-2 bowel movements on a daily basis.  After his C. difficile infection he has between 2 and 4 bowel movements daily.  They look more wispy rather than solid formed stools that he had previously.  He has been on probiotics in the past without much benefit.  He does not take prebiotic's.  He has had only 1 episode of rectal bleeding more than 7 years ago.  He has diverticulosis.  Colon polyps were found on his flexible sigmoidoscopy in 2018 and a sessile serrated adenoma greater than 1 cm was found in the cecum in 2019.  He was recommended a 3-year follow-up.  He has moved from [place] to [place] with his family.  Currently is not having any other significant  issues at this time and would like to get up-to-date from a colon cancer screening perspective.  In 2017 due to significant odynophagia he underwent an endoscopy and was found to have esophageal ulcers.  Biopsies did not show evidence of eosinophilic esophagitis or fungal infection or viral infection based on pathology reports (we do not have the actual report of the EGD itself) but it was theorized that he may have had pill induced esophagitis.  He has never had a repeat endoscopy to evaluate for healing.  He is no longer having odynophagia.  He has very rare episodes of pyrosis.  There is no family history of colon cancer or other GI malignancies in his direct family though a paternal grandmother had stomach cancer.  GI Review of Systems Positive as above Negative for dysphagia, nausea, vomiting, pain, melena, hematochezia  Review of Systems General: Denies fevers/chills/weight loss unintentionally HEENT: Denies oral lesions Cardiovascular: Denies chest pain/palpitations Pulmonary: Denies shortness of breath Gastroenterological: See HPI Genitourinary: Denies darkened urine or hematuria Hematological: Denies easy bruising/bleeding Endocrine: Denies temperature intolerance Dermatological: Denies jaundice Psychological: Mood is stable   Medications Current Outpatient Medications  Medication Sig Dispense Refill  . Atogepant (QULIPTA) 60 MG TABS Take 60 mg by mouth daily. 30 tablet 5  . clindamycin (CLEOCIN T) 1 % external solution Apply topically.    Marland Kitchen loratadine (CLARITIN) 10 MG tablet Take by mouth.    . Na Sulfate-K Sulfate-Mg Sulf 17.5-3.13-1.6 GM/177ML SOLN Take 1 kit by mouth once for 1 dose. 354 mL 0  . Ubrogepant (UBRELVY) 100 MG TABS  Take 1 tablet by mouth as needed (May repeat in 2 hours.  Maximum 2 tablets in 24 hours.). 16 tablet 5  . zolpidem (AMBIEN) 5 MG tablet Take 5 mg by mouth as needed.     No current facility-administered medications for this visit.     Allergies Allergies  Allergen Reactions  . Clindamycin/Lincomycin   . Sulfa Antibiotics     Histories Past Medical History:  Diagnosis Date  . Colon polyps   . Migraine headache   . Osteoarthritis    shoulder  . Shoulder pain    left   History reviewed. No pertinent surgical history. Social History   Socioeconomic History  . Marital status: Married    Spouse name: Not on file  . Number of children: 2  . Years of education: 64  . Highest education level: Not on file  Occupational History  . Occupation: Optometrist  Tobacco Use  . Smoking status: Never Smoker  . Smokeless tobacco: Never Used  Vaping Use  . Vaping Use: Never used  Substance and Sexual Activity  . Alcohol use: Yes    Comment: 2-3 on weekends  . Drug use: Never  . Sexual activity: Yes    Partners: Female  Other Topics Concern  . Not on file  Social History Narrative   Right handed   Drinks caffeine    Two story home   Social Determinants of Health   Financial Resource Strain: Not on file  Food Insecurity: Not on file  Transportation Needs: Not on file  Physical Activity: Not on file  Stress: Not on file  Social Connections: Not on file  Intimate Partner Violence: Not on file   Family History  Problem Relation Age of Onset  . Irritable bowel syndrome Mother   . Anemia Mother   . Diverticulosis Father   . Colon polyps Father   . Irritable bowel syndrome Brother   . Heart disease Maternal Grandfather   . Heart attack Maternal Grandfather   . Heart disease Paternal Grandmother        CAD  . Colon polyps Maternal Grandmother   . Stomach cancer Paternal Grandfather   . Pancreatic cancer Neg Hx   . Esophageal cancer Neg Hx   . Liver disease Neg Hx   . Colon cancer Neg Hx   . Inflammatory bowel disease Neg Hx   . Rectal cancer Neg Hx    I have reviewed his medical, social, and family history in detail and updated the electronic medical record as necessary.    PHYSICAL EXAMINATION   BP 124/78   Pulse 70   Ht 5' 9.5" (1.765 m)   Wt 205 lb 6 oz (93.2 kg)   SpO2 98%   BMI 29.89 kg/m  Wt Readings from Last 3 Encounters:  06/30/20 205 lb 6 oz (93.2 kg)  04/12/20 208 lb 12.8 oz (94.7 kg)  02/26/20 212 lb (96.2 kg)  GEN: NAD, appears stated age, doesn't appear chronically ill PSYCH: Cooperative, without pressured speech EYE: Conjunctivae pink, sclerae anicteric ENT: Masked CV: RR without R/Gs  RESP: CTAB posteriorly, without wheezing GI: NABS, soft, NT/ND, without rebound or guarding, no HSM appreciated  MSK/EXT: No lower extremity edema SKIN: No jaundice NEURO:  Alert & Oriented x 3, no focal deficits   REVIEW OF DATA  I reviewed the following data at the time of this encounter:  GI Procedures and Studies  Unable to read reports but pathology showed the following 2017 EGD DIAGNOSIS  A. Esophagus, distal, endoscopic biopsy: Esophageal squamous mucosa with no significant pathologic diagnosis. B. Esophagus, mid, endoscopic biopsy: Esophageal squamous mucosa with no significant pathologic diagnosis.   2018 flexible sigmoidoscopy DIAGNOSIS    A. Rectosigmoid polyps, endoscopic polypectomy: Tubular adenoma, one fragment. Negative for high grade dysplasia and carcinoma. Hyperplastic polyp, one fragment.   2019 colonoscopy Colonoscopy had 2 polyps removed with 1 polyp that was greater than 1 cm A. Cecal polyp, endoscopic biopsy: Fragments of sessile serrated adenoma. No conventional dysplasia is seen.  Laboratory Studies  Reviewed those in epic and care everywhere  Imaging Studies  2018 CT abdomen pelvis with contrast done at [facility] Findings:  The heart size normal with no pericardial effusion. The lung bases are  clear. The liver is normal in size and contour. Patent portal and hepatic  veins. No intra or extrahepatic biliary duct dilatation. No focal  lesions. Normal gallbladder. Normal pancreas, spleen, and bilateral  adrenal glands. The  kidneys are normal in appearance with no  hydronephrosis or nephrolithiasis.  The prostate is not enlarged. The bladder is decompressed. There is no free fluid nor free intraperitoneal air. The large and small bowel are  normal in caliber with no evidence of obstruction. Normal right lower  quadrant appendix. There are scattered diverticuli of the descending and sigmoid colon without evidence of diverticulitis.  There is wall  thickening of the sigmoid colon which may be due to underdistention. The stomach is decompressed.  The abdominal aorta is normal in caliber. The central mesenteric  vasculature are patent. There are no enlarged abdominal, pelvic, or  inguinal lymph nodes. There are no aggressive appearing osseous lesions within the abdomen and pelvis.  Impression:  No CT explanation for acute abdominal pain.    ASSESSMENT  Bradley Lawson is a 40 y.o. male with a pmh significant for osteoarthritis, colon polyps (TA & SSPs), diverticulosis, prior C. difficile infection, prior esophageal ulcers (query pill induced), chronic loose bowel movements.  The patient is seen today for evaluation and management of:  1. Loose bowel movements   2. Hx of adenomatous colonic polyps   3. History of esophageal ulcer   4. History of Clostridioides difficile infection    The patient is clinically and hemodynamically stable.  It seems that he had an alteration in his microbiome after his C. difficile infection back in 2018.  He has chronic looser bowel movements than he previously had.  Probiotics were not helpful for him.  We have discussed bulking prebiotic fiber in an effort of trying to see if that may make a difference for him and he will begin initiation of that and will coming weeks.  He is quite young but has evidence of an advanced adenoma (and SSP greater than 1 cm) and also a prior TA.  Colonoscopy for colon polyp surveillance is indicated at this time as has been 3 years since his last  procedure.  His family (in particular his children) should begin colon cancer screening at the age of 55 (81 years prior to his diagnosis of an advanced adenoma).  The patient's prior history of esophageal ulcers was felt to potentially be pill induced.  Thankfully he has not had significant symptoms recur but to assure complete healing repeat endoscopy is recommended and we will perform that at the same time as his colonoscopy for colon polyp surveillance.  The risks and benefits of endoscopic evaluation were discussed with the patient; these include but are not limited to the risk of perforation, infection,  bleeding, missed lesions, lack of diagnosis, severe illness requiring hospitalization, as well as anesthesia and sedation related illnesses.  I am going to obtain some laboratories to try and better understand some of his bowel habit changes.  Small intestine bacterial overgrowth will need to be considered in the future as well as exocrine pancreas insufficiency.  The patient is agreeable to proceed.  All patient questions were answered to the best of my ability, and the patient agrees to the aforementioned plan of action with follow-up as indicated.   PLAN  Laboratories as outlined below Fecal elastase to be obtained Consider SIBO breath testing in future Bulking fiber (Benefiber or Metamucil once to twice daily to be initiated) Surveillance colonoscopy for prior history of adenomatous and serrated polyps due this year Diagnostic endoscopy to ensure healing of previous esophageal ulcers to be scheduled  -patient would like to perform EGD and colonoscopy in the fall of this year   Orders Placed This Encounter  Procedures  . TSH  . Tissue transglutaminase, IgA  . Pancreatic elastase, fecal  . IgA  . Ambulatory referral to Gastroenterology    New Prescriptions   NA SULFATE-K SULFATE-MG SULF 17.5-3.13-1.6 GM/177ML SOLN    Take 1 kit by mouth once for 1 dose.   Modified Medications   No  medications on file    Planned Follow Up No follow-ups on file.   Total Time in Face-to-Face and in Coordination of Care for patient including independent/personal interpretation/review of prior testing, medical history, examination, medication adjustment, communicating results with the patient directly, and documentation with the EHR is 45 minutes.   Justice Britain, MD [facility] Office # [phone]

## 2020-07-01 LAB — IGA: Immunoglobulin A: 116 mg/dL (ref 47–310)

## 2020-07-01 LAB — TISSUE TRANSGLUTAMINASE, IGA: (tTG) Ab, IgA: <1 U/mL

## 2020-07-02 ENCOUNTER — Other Ambulatory Visit: Payer: Managed Care, Other (non HMO)

## 2020-07-02 DIAGNOSIS — Z8619 Personal history of other infectious and parasitic diseases: Secondary | ICD-10-CM

## 2020-07-02 DIAGNOSIS — R195 Other fecal abnormalities: Secondary | ICD-10-CM

## 2020-07-02 DIAGNOSIS — Z86010 Personal history of colonic polyps: Secondary | ICD-10-CM

## 2020-07-08 LAB — PANCREATIC ELASTASE, FECAL: Pancreatic Elastase-1, Stool: >500 mcg/g

## 2020-07-16 ENCOUNTER — Encounter: Payer: Self-pay | Admitting: Gastroenterology

## 2020-07-16 NOTE — Progress Notes (Signed)
Documentation of patient's outside records to be scanned into chart.  May 2019 colonoscopy Perianal and digital rectal exams were normal. Terminal ileum appeared normal. A 50 mm polyp was found in the cecum.  Snare mucosal resection performed. 7 mm polyp in cecum.  Forceps and snare mucosal resection.  Performed. Scattered diverticula found in the colon. Retroflexed view of the distal rectum and anal verge were normal.  GM

## 2020-08-31 NOTE — Progress Notes (Signed)
NEUROLOGY FOLLOW UP OFFICE NOTE  Bradley Lawson [id number]  Assessment/Plan:   Probable chronic migraine without aura, without status migrainosus, not intractable complicated by medication-overuse.  Migraine prevention:  Qulipta 60mg  daily Migraine rescue:  Ubrelvy 100mg  Limit use of pain relievers to no more than 2 days out of week to prevent risk of rebound or medication-overuse headache. Keep headache diary Follow up 6 months.   Subjective:  Bradley Lawson is a 40 year old right-handed male who follows up for headaches.  UPDATE: Last visit, tapered off of propranolol and started [place].  Doing much better. Intensity:  moderate Duration:  1 hour Frequency:  once a week or less Current NSAIDS/analgesics:  none Current triptans:  none Current ergotamine:  none Current anti-emetic:  none Current muscle relaxants:  none Current Antihypertensive medications:  none Current Antidepressant medications:  none Current Anticonvulsant medications:  none Current anti-CGRP:  Qulipta 60mg  QD, Ubrelvy 100mg  Current Vitamins/Herbal/Supplements:  none Current Antihistamines/Decongestants:  Claritin Other therapy:  none Hormone/birth control:  none Other medications:  Ambien  Caffeine:  2 cups of coffee daily.  1 can Coke Zero daily Diet:  36 oz water daily.  Does not skip meals Exercise:  Routine.  Lifts weights Depression:  no; Anxiety:  A little bit Other pain:  no Sleep:  good  HISTORY:  He reports history of migraines since childhood.  He has been on propranolol for many years but recently doesn't think it has been effective.  They are moderate bifrontal pressure ache.  Some photophobia and phonophobia.  No nausea, vomiting, visual disturbance, numbness and weakness. They last 3-4 hours.  They occur every other day.  Takes ibuprofen 2 to 3 tablets almost daily.  Triggers include noise, certain smells (perfumes, flowers), stress and sometimes exertion (lifting weights).   Applying pressure on the temples help relieve it.     Previous imaging: 01/23/2013 MRI BRAIN W WO:  Normal study 06/11/2017 MRI BRAIN/PITUITARY W WO:  Normal MRI of the brain and pituitary   Past NSAIDS/analgesics:  Tylenol, Excedrin, ibuprofen Past abortive triptans:  Sumatriptan, Zomig-MLT Past abortive ergotamine:  none Past muscle relaxants:  none Past anti-emetic:  none Past antihypertensive medications:  propranolol ER 160mg  Past antidepressant medications:  none Past anticonvulsant medications:  topiramate (worsened tinnitus), zonisamide Past anti-CGRP:  none Past vitamins/Herbal/Supplements:  none Past antihistamines/decongestants:  Flonase Other past therapies:  none    Family history of headache:  Father, daughter  PAST MEDICAL HISTORY: Past Medical History:  Diagnosis Date   Colon polyps    Migraine headache    Osteoarthritis    shoulder   Shoulder pain    left    MEDICATIONS: Current Outpatient Medications on File Prior to Visit  Medication Sig Dispense Refill   Atogepant (QULIPTA) 60 MG TABS Take 60 mg by mouth daily. 30 tablet 5   clindamycin (CLEOCIN T) 1 % external solution Apply topically.     loratadine (CLARITIN) 10 MG tablet Take by mouth.     Ubrogepant (UBRELVY) 100 MG TABS Take 1 tablet by mouth as needed (May repeat in 2 hours.  Maximum 2 tablets in 24 hours.). 16 tablet 5   zolpidem (AMBIEN) 5 MG tablet Take 5 mg by mouth as needed.     No current facility-administered medications on file prior to visit.    ALLERGIES: Allergies  Allergen Reactions   Clindamycin/Lincomycin    Sulfa Antibiotics     FAMILY HISTORY: Family History  Problem Relation Age of Onset   Irritable  bowel syndrome Mother    Anemia Mother    Diverticulosis Father    Colon polyps Father    Irritable bowel syndrome Brother    Heart disease Maternal Grandfather    Heart attack Maternal Grandfather    Heart disease Paternal Grandmother        CAD   Colon polyps  Maternal Grandmother    Stomach cancer Paternal Grandfather    Pancreatic cancer Neg Hx    Esophageal cancer Neg Hx    Liver disease Neg Hx    Colon cancer Neg Hx    Inflammatory bowel disease Neg Hx    Rectal cancer Neg Hx       Objective:  Blood pressure 123/79, pulse 73, height 5' 9.5" (1.765 m), weight 209 lb 6 oz (95 kg), SpO2 97 %. General: No acute distress.  Patient appears well-groomed.     Bradley Clines, DO  CC: Bradley Lawson, [place]

## 2020-09-02 ENCOUNTER — Ambulatory Visit (INDEPENDENT_AMBULATORY_CARE_PROVIDER_SITE_OTHER): Payer: Managed Care, Other (non HMO) | Admitting: Neurology

## 2020-09-02 ENCOUNTER — Encounter: Payer: Self-pay | Admitting: Neurology

## 2020-09-02 ENCOUNTER — Other Ambulatory Visit: Payer: Self-pay

## 2020-09-02 VITALS — BP 123/79 | HR 73 | Ht 69.5 in | Wt 209.4 lb

## 2020-09-02 DIAGNOSIS — G43009 Migraine without aura, not intractable, without status migrainosus: Secondary | ICD-10-CM | POA: Diagnosis not present

## 2020-09-02 NOTE — Patient Instructions (Addendum)
Qulipta 60mg  daily Ubrelvy as needed May use ibuprofen.  Limit use of pain relievers to no more than 2 days out of week to prevent risk of rebound or medication-overuse headache. Keep headache diary

## 2020-09-06 ENCOUNTER — Other Ambulatory Visit: Payer: Self-pay | Admitting: Neurology

## 2020-09-07 ENCOUNTER — Other Ambulatory Visit: Payer: Self-pay | Admitting: Neurology

## 2020-10-01 ENCOUNTER — Ambulatory Visit (AMBULATORY_SURGERY_CENTER): Payer: Managed Care, Other (non HMO) | Admitting: Gastroenterology

## 2020-10-01 ENCOUNTER — Other Ambulatory Visit: Payer: Self-pay

## 2020-10-01 ENCOUNTER — Encounter: Payer: Self-pay | Admitting: Gastroenterology

## 2020-10-01 VITALS — BP 119/78 | HR 72 | Temp 98.4°F | Resp 13 | Ht 69.5 in | Wt 209.0 lb

## 2020-10-01 DIAGNOSIS — K31A0 Gastric intestinal metaplasia, unspecified: Secondary | ICD-10-CM | POA: Insufficient documentation

## 2020-10-01 DIAGNOSIS — Z8719 Personal history of other diseases of the digestive system: Secondary | ICD-10-CM | POA: Diagnosis not present

## 2020-10-01 DIAGNOSIS — R195 Other fecal abnormalities: Secondary | ICD-10-CM

## 2020-10-01 DIAGNOSIS — R12 Heartburn: Secondary | ICD-10-CM

## 2020-10-01 DIAGNOSIS — K2970 Gastritis, unspecified, without bleeding: Secondary | ICD-10-CM

## 2020-10-01 DIAGNOSIS — D127 Benign neoplasm of rectosigmoid junction: Secondary | ICD-10-CM | POA: Diagnosis not present

## 2020-10-01 DIAGNOSIS — K5730 Diverticulosis of large intestine without perforation or abscess without bleeding: Secondary | ICD-10-CM

## 2020-10-01 DIAGNOSIS — Z86010 Personal history of colonic polyps: Secondary | ICD-10-CM

## 2020-10-01 DIAGNOSIS — D128 Benign neoplasm of rectum: Secondary | ICD-10-CM | POA: Diagnosis not present

## 2020-10-01 MED ORDER — SODIUM CHLORIDE 0.9 % IV SOLN: 500.0000 mL | Freq: Once | INTRAVENOUS | Status: DC

## 2020-10-01 NOTE — Op Note (Signed)
[facility] Patient Name: Bradley Lawson Procedure Date: 10/01/2020 9:24 AM MRN: [id number] Endoscopist: Justice Britain , MD Age: 40 Referring MD:  Date of Birth: [birth year] Gender: Male Account #: [id number] Procedure:                Upper GI endoscopy Indications:              Heartburn, Follow-up of previous esophageal ulcers                            on prior EGD, change of bowel habits since C.                            difficile infection previously Medicines:                Monitored Anesthesia Care Procedure:                Pre-Anesthesia Assessment:                           - Prior to the procedure, a History and Physical                            was performed, and patient medications and                            allergies were reviewed. The patient's tolerance of                            previous anesthesia was also reviewed. The risks                            and benefits of the procedure and the sedation                            options and risks were discussed with the patient.                            All questions were answered, and informed consent                            was obtained. Prior Anticoagulants: The patient has                            taken no previous anticoagulant or antiplatelet                            agents. ASA Grade Assessment: II - A patient with                            mild systemic disease. After reviewing the risks                            and benefits, the patient was deemed in  satisfactory condition to undergo the procedure.                           After obtaining informed consent, the endoscope was                            passed under direct vision. Throughout the                            procedure, the patient's blood pressure, pulse, and                            oxygen saturations were monitored continuously. The                            GIF [id number]  was introduced through the                            mouth, and advanced to the second part of duodenum.                            The upper GI endoscopy was accomplished without                            difficulty. The patient tolerated the procedure. Scope In: Scope Out: Findings:                 No gross lesions were noted in the entire esophagus.                           The Z-line was irregular and was found 40 cm from                            the incisors.                           Patchy mild inflammation characterized by erosions                            and erythema was found in the gastric body and in                            the gastric antrum.                           No other gross lesions were noted in the entire                            examined stomach. Biopsies were taken with a cold                            forceps for histology and Helicobacter pylori  testing.                           No gross lesions were noted in the duodenal bulb,                            in the first portion of the duodenum and in the                            second portion of the duodenum. Biopsies were taken                            with a cold forceps for histology. Complications:            No immediate complications. Estimated Blood Loss:     Estimated blood loss was minimal. Impression:               - No gross lesions in esophagus. Z-line irregular,                            40 cm from the incisors.                           - Gastritis in body/antrum. No other gross lesions                            in the stomach. Biopsied.                           - No gross lesions in the duodenal bulb, in the                            first portion of the duodenum and in the second                            portion of the duodenum. Biopsied. Recommendation:           - Proceed to scheduled colonoscopy.                           - Observe patient's  clinical course.                           - Await pathology results.                           - Consider initiation of low-dose PPI, but may be                            able to wait until biopsies as well to try to heal                            gastritis.                           - Minimize NSAIDs  as able.                           - The findings and recommendations were discussed                            with the patient.                           - The findings and recommendations were discussed                            with the designated responsible adult. Justice Britain, MD 10/01/2020 10:09:21 AM

## 2020-10-01 NOTE — Progress Notes (Signed)
GASTROENTEROLOGY PROCEDURE H&P NOTE   Primary Care Physician: Marrian Salvage, FNP  HPI: Bradley Lawson is a 40 y.o. male who presents for EGD/Colonoscopy for history of prior esophageal ulcers and pyrosis and surveillance of prior colon polyps (TA & SSP).  Past Medical History:  Diagnosis Date   Allergy    Anxiety    Colon polyps    Migraine headache    Osteoarthritis    shoulder   Shoulder pain    left   Past Surgical History:  Procedure Laterality Date   COLONOSCOPY     UPPER GASTROINTESTINAL ENDOSCOPY     Current Outpatient Medications  Medication Sig Dispense Refill   loratadine (CLARITIN) 10 MG tablet Take by mouth.     QULIPTA 60 MG TABS TAKE ONE TABLET BY MOUTH EVERY DAY 30 tablet 5   clindamycin (CLEOCIN T) 1 % external solution Apply topically.     Ubrogepant (UBRELVY) 100 MG TABS Take 1 tablet by mouth as needed (May repeat in 2 hours.  Maximum 2 tablets in 24 hours.). 16 tablet 5   zolpidem (AMBIEN) 5 MG tablet Take 5 mg by mouth as needed. (Patient not taking: No sig reported)     Current Facility-Administered Medications  Medication Dose Route Frequency Provider Last Rate Last Admin   0.9 %  sodium chloride infusion  500 mL Intravenous Once Mansouraty, Telford Nab., MD        Current Outpatient Medications:    loratadine (CLARITIN) 10 MG tablet, Take by mouth., Disp: , Rfl:    QULIPTA 60 MG TABS, TAKE ONE TABLET BY MOUTH EVERY DAY, Disp: 30 tablet, Rfl: 5   clindamycin (CLEOCIN T) 1 % external solution, Apply topically., Disp: , Rfl:    Ubrogepant (UBRELVY) 100 MG TABS, Take 1 tablet by mouth as needed (May repeat in 2 hours.  Maximum 2 tablets in 24 hours.)., Disp: 16 tablet, Rfl: 5   zolpidem (AMBIEN) 5 MG tablet, Take 5 mg by mouth as needed. (Patient not taking: No sig reported), Disp: , Rfl:   Current Facility-Administered Medications:    0.9 %  sodium chloride infusion, 500 mL, Intravenous, Once, Mansouraty, Telford Nab., MD Allergies   Allergen Reactions   Clindamycin/Lincomycin    Sulfa Antibiotics    Family History  Problem Relation Age of Onset   Colon polyps Mother    Irritable bowel syndrome Mother    Anemia Mother    Diverticulosis Father    Colon polyps Father    Irritable bowel syndrome Brother    Colon polyps Maternal Grandmother    Heart disease Maternal Grandfather    Heart attack Maternal Grandfather    Heart disease Paternal Grandmother        CAD   Stomach cancer Paternal Grandfather    Pancreatic cancer Neg Hx    Esophageal cancer Neg Hx    Liver disease Neg Hx    Colon cancer Neg Hx    Inflammatory bowel disease Neg Hx    Rectal cancer Neg Hx    Social History   Socioeconomic History   Marital status: Married    Spouse name: Not on file   Number of children: 2   Years of education: 17   Highest education level: Not on file  Occupational History   Occupation: Optometrist  Tobacco Use   Smoking status: Never   Smokeless tobacco: Never  Vaping Use   Vaping Use: Never used  Substance and Sexual Activity   Alcohol use: Yes  Comment: 2-3 on weekends   Drug use: Never   Sexual activity: Yes    Partners: Female  Other Topics Concern   Not on file  Social History Narrative   Right handed   Drinks caffeine    Two story home   Social Determinants of Health   Financial Resource Strain: Not on file  Food Insecurity: Not on file  Transportation Needs: Not on file  Physical Activity: Not on file  Stress: Not on file  Social Connections: Not on file  Intimate Partner Violence: Not on file    Physical Exam: Vital signs in last 24 hours: '@VSRANGES'$ @   GEN: NAD EYE: Sclerae anicteric ENT: MMM CV: Non-tachycardic GI: Soft, NT/ND NEURO:  Alert & Oriented x 3  Lab Results: No results for input(s): WBC, HGB, HCT, PLT in the last 72 hours. BMET No results for input(s): NA, K, CL, CO2, GLUCOSE, BUN, CREATININE, CALCIUM in the last 72 hours. LFT No results for input(s): PROT,  ALBUMIN, AST, ALT, ALKPHOS, BILITOT, BILIDIR, IBILI in the last 72 hours. PT/INR No results for input(s): LABPROT, INR in the last 72 hours.   Impression / Plan: This is a 40 y.o.male who presents for EGD/Colonoscopy for history of prior esophageal ulcers and pyrosis and surveillance of prior colon polyps (TA & SSP).  The risks and benefits of endoscopic evaluation/treatment were discussed with the patient and/or family; these include but are not limited to the risk of perforation, infection, bleeding, missed lesions, lack of diagnosis, severe illness requiring hospitalization, as well as anesthesia and sedation related illnesses.  The patient's history has been reviewed, patient examined, no change in status, and deemed stable for procedure.  The patient and/or family is agreeable to proceed.    Justice Britain, MD [facility] Office # [id number]

## 2020-10-01 NOTE — Progress Notes (Signed)
Pt in recovery with monitors in place, VSS. Report given to receiving RN. Bite guard was placed with pt awake to ensure comfort. No dental or soft tissue damage noted. RN will remove the guard when the pt is awake.  

## 2020-10-01 NOTE — Progress Notes (Signed)
[facility] 

## 2020-10-01 NOTE — Op Note (Signed)
[facility] Patient Name: Bradley Lawson Procedure Date: 10/01/2020 9:23 AM MRN: [id number] Endoscopist: Justice Britain , MD Age: 40 Referring MD:  Date of Birth: 1980-11-02 Gender: Male Account #: [id number] Procedure:                Colonoscopy Indications:              High risk colon cancer surveillance: Personal                            history of sessile serrated colon polyp (10 mm or                            greater in size) as well as TAs, Incidental change                            in bowel habits noted after C. difficile infection Medicines:                Monitored Anesthesia Care Procedure:                Pre-Anesthesia Assessment:                           - Prior to the procedure, a History and Physical                            was performed, and patient medications and                            allergies were reviewed. The patient's tolerance of                            previous anesthesia was also reviewed. The risks                            and benefits of the procedure and the sedation                            options and risks were discussed with the patient.                            All questions were answered, and informed consent                            was obtained. Prior Anticoagulants: The patient has                            taken no previous anticoagulant or antiplatelet                            agents. ASA Grade Assessment: II - A patient with                            mild systemic disease. After reviewing the risks  and benefits, the patient was deemed in                            satisfactory condition to undergo the procedure.                           After obtaining informed consent, the colonoscope                            was passed under direct vision. Throughout the                            procedure, the patient's blood pressure, pulse, and                            oxygen  saturations were monitored continuously. The                            Colonoscope was introduced through the anus and                            advanced to the 5 cm into the ileum. The                            colonoscopy was performed without difficulty. The                            patient tolerated the procedure. The quality of the                            bowel preparation was adequate. The terminal ileum,                            ileocecal valve, appendiceal orifice, and rectum                            were photographed. Scope In: 9:42:46 AM Scope Out: 10:00:29 AM Scope Withdrawal Time: 0 hours 14 minutes 6 seconds  Total Procedure Duration: 0 hours 17 minutes 43 seconds  Findings:                 The digital rectal exam findings include                            hemorrhoids. Pertinent negatives include no                            palpable rectal lesions.                           The terminal ileum and ileocecal valve appeared                            normal.  Three sessile polyps were found in the rectum and                            recto-sigmoid colon. The polyps were 2 to 4 mm in                            size. These polyps were removed with a cold snare.                            Resection and retrieval were complete.                           Multiple small and large-mouthed diverticula were                            found in the recto-sigmoid colon, sigmoid colon and                            descending colon.                           Normal mucosa was found in the entire colon                            otherwise. Biopsies for histology were taken with a                            cold forceps from the entire colon for evaluation                            of microscopic colitis.                           Non-bleeding non-thrombosed external and internal                            hemorrhoids were found during retroflexion,  during                            perianal exam and during digital exam. The                            hemorrhoids were Grade II (internal hemorrhoids                            that prolapse but reduce spontaneously). Complications:            No immediate complications. Estimated Blood Loss:     Estimated blood loss was minimal. Impression:               - Hemorrhoids found on digital rectal exam.                           - The examined portion of the ileum was normal.                           -  Three 2 to 4 mm polyps in the rectum and at the                            recto-sigmoid colon, removed with a cold snare.                            Resected and retrieved.                           - Diverticulosis in the recto-sigmoid colon, in the                            sigmoid colon and in the descending colon.                           - Normal mucosa in the entire examined colon                            otherwise. Biopsied.                           - Non-bleeding non-thrombosed external and internal                            hemorrhoids. Recommendation:           - The patient will be observed post-procedure,                            until all discharge criteria are met.                           - Discharge patient to home.                           - Patient has a contact number available for                            emergencies. The signs and symptoms of potential                            delayed complications were discussed with the                            patient. Return to normal activities tomorrow.                            Written discharge instructions were provided to the                            patient.                           - High fiber diet.                           -  Use FiberCon 1-2 tablets PO daily.                           - Continue present medications.                           - Await pathology results.                           -  Repeat colonoscopy in 3 - 5 years for                            surveillance (if no adenomatous tissue, 5-years                            recall due to early age of TA & SSPs being found                            previously).                           - The findings and recommendations were discussed                            with the patient.                           - The findings and recommendations were discussed                            with the designated responsible adult. Justice Britain, MD 10/01/2020 10:13:29 AM

## 2020-10-01 NOTE — Patient Instructions (Signed)
Gastritis, hemorrhoids, diverticulosis and 3 polyps removed and sent to pathology.  High fiber recommended.  Use FiberCon 1-2 tablets daily.   Await pathology for final recommendations.  Resume previous medications.  Handouts on findings given to patient.     YOU HAD AN ENDOSCOPIC PROCEDURE TODAY AT [facility]:   Refer to the procedure report that was given to you for any specific questions about what was found during the examination.  If the procedure report does not answer your questions, please call your gastroenterologist to clarify.  If you requested that your care partner not be given the details of your procedure findings, then the procedure report has been included in a sealed envelope for you to review at your convenience later.  YOU SHOULD EXPECT: Some feelings of bloating in the abdomen. Passage of more gas than usual.  Walking can help get rid of the air that was put into your GI tract during the procedure and reduce the bloating. If you had a lower endoscopy (such as a colonoscopy or flexible sigmoidoscopy) you may notice spotting of blood in your stool or on the toilet paper. If you underwent a bowel prep for your procedure, you may not have a normal bowel movement for a few days.  Please Note:  You might notice some irritation and congestion in your nose or some drainage.  This is from the oxygen used during your procedure.  There is no need for concern and it should clear up in a day or so.  SYMPTOMS TO REPORT IMMEDIATELY:  Following lower endoscopy (colonoscopy or flexible sigmoidoscopy):  Excessive amounts of blood in the stool  Significant tenderness or worsening of abdominal pains  Swelling of the abdomen that is new, acute  Fever of 100F or higher  Following upper endoscopy (EGD)  Vomiting of blood or coffee ground material  New chest pain or pain under the shoulder blades  Painful or persistently difficult swallowing  New shortness of breath  Fever of  100F or higher  Black, tarry-looking stools  For urgent or emergent issues, a gastroenterologist can be reached at any hour by calling [phone]. Do not use MyChart messaging for urgent concerns.    DIET:  We do recommend a small meal at first, but then you may proceed to your regular diet.  Drink plenty of fluids but you should avoid alcoholic beverages for 24 hours.  ACTIVITY:  You should plan to take it easy for the rest of today and you should NOT DRIVE or use heavy machinery until tomorrow (because of the sedation medicines used during the test).    FOLLOW UP: Our staff will call the number listed on your records 48-72 hours following your procedure to check on you and address any questions or concerns that you may have regarding the information given to you following your procedure. If we do not reach you, we will leave a message.  We will attempt to reach you two times.  During this call, we will ask if you have developed any symptoms of COVID 19. If you develop any symptoms (ie: fever, flu-like symptoms, shortness of breath, cough etc.) before then, please call [phone].  If you test positive for Covid 19 in the 2 weeks post procedure, please call and report this information to [place].    If any biopsies were taken you will be contacted by phone or by letter within the next 1-3 weeks.  Please call us at [phone] if you have not  heard about the biopsies in 3 weeks.    SIGNATURES/CONFIDENTIALITY: You and/or your care partner have signed paperwork which will be entered into your electronic medical record.  These signatures attest to the fact that that the information above on your After Visit Summary has been reviewed and is understood.  Full responsibility of the confidentiality of this discharge information lies with you and/or your care-partner.

## 2020-10-01 NOTE — Progress Notes (Signed)
Called to room to assist during endoscopic procedure.  Patient ID and intended procedure confirmed with present staff. Received instructions for my participation in the procedure from the performing physician.  

## 2020-10-02 ENCOUNTER — Telehealth: Payer: Self-pay | Admitting: Gastroenterology

## 2020-10-02 NOTE — Telephone Encounter (Signed)
-----  Message from Sheffield Slider sent at 10/01/2020  4:15 PM EDT ----- Regarding: peer ro peer Good afternoon Dr.Mansouraty I just received pt's case status and it was denied for his egd that was done today. It needs a peer to peer in order to ger it approve. Per their decision one of the the following must be met:  Results of a prior study showed a need for the study to be repeated  Patient have had a change in their symptoms since prior study.

## 2020-10-02 NOTE — Telephone Encounter (Signed)
I called and spoke with [employer] on 8/19. After 15-minute hold I was able to schedule a peer to peer. The case number is [id number]. At 5:15 PM, I spoke with Dr. Leonette Most. After discussion with the physician and going over the patient's prior history of moderate to severe esophagitis in the setting of what was pill induced versus GERD related versus both and not having prior follow-up as well as the patient's history of alteration of bowel habits the EGD was accepted. Our authorization number is [id number]. This is good through 03/30/2021 for EGD coverage.  Please let me know if there is anything else that you need.  Thank you. GM

## 2020-10-05 ENCOUNTER — Telehealth: Payer: Self-pay | Admitting: *Deleted

## 2020-10-05 NOTE — Telephone Encounter (Signed)
  Follow up Call-  Call back number 10/01/2020  Post procedure Call Back phone  # [phone]  Permission to leave phone message Yes  Some recent data might be hidden     Patient questions:  Do you have a fever, pain , or abdominal swelling? No. Pain Score  0 *  Have you tolerated food without any problems? Yes.    Have you been able to return to your normal activities? Yes.    Do you have any questions about your discharge instructions: Diet   No. Medications  No. Follow up visit  No.  Do you have questions or concerns about your Care? No.  Actions: * If pain score is 4 or above: No action needed, pain <4.  Have you developed a fever since your procedure? no  2.   Have you had an respiratory symptoms (SOB or cough) since your procedure? no  3.   Have you tested positive for COVID 19 since your procedure no  4.   Have you had any family members/close contacts diagnosed with the COVID 19 since your procedure?  no   If yes to any of these questions please route to Joylene John, RN and Joella Prince, RN

## 2020-10-05 NOTE — Telephone Encounter (Signed)
Message left

## 2020-10-06 ENCOUNTER — Encounter: Payer: Self-pay | Admitting: Gastroenterology

## 2020-10-22 ENCOUNTER — Encounter: Payer: Self-pay | Admitting: Gastroenterology

## 2020-10-22 NOTE — Telephone Encounter (Signed)
I called and spoke with the patient this morning. We discussed in detail the results of his upper and lower endoscopy. We discussed how intestinal metaplasia can be a risk factor for gastric cancer but there is no evidence of any precancerous changes currently. With the patient's paternal grandfather having had stomach cancer he obviously has concerns about the risk of this in the future. 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 We discussed his colonoscopy results and due to his prior history of polyps and his current history of adenomatous polyps it is reasonable for [place] to maintain a more aggressive surveillance due to his young age. We agreed to a 4-year colonoscopy follow-up. Patty please move forward with scheduling or putting him for an endoscopy in December of this year with gastric mapping done in the [facility] Colonoscopy in 4 years. Thanks. GM

## 2020-10-22 NOTE — Telephone Encounter (Signed)
Fyi, pt scheduled for repeat EGD on 01/19/21 at 8:30am at [facility]. PV 01/04/21 at 8:00am.

## 2020-11-09 ENCOUNTER — Encounter: Payer: Self-pay | Admitting: Family

## 2020-11-09 ENCOUNTER — Other Ambulatory Visit (INDEPENDENT_AMBULATORY_CARE_PROVIDER_SITE_OTHER): Payer: Managed Care, Other (non HMO)

## 2020-11-09 DIAGNOSIS — E785 Hyperlipidemia, unspecified: Secondary | ICD-10-CM

## 2020-11-09 LAB — LIPID PANEL
Cholesterol: 236 mg/dL — ABNORMAL HIGH (ref 0–200)
HDL: 42.6 mg/dL (ref >39.00)
LDL Cholesterol: 169 mg/dL — ABNORMAL HIGH (ref 0–99)
NonHDL: 192.99
Total CHOL/HDL Ratio: 6
Triglycerides: 120 mg/dL (ref 0.0–149.0)
VLDL: 24 mg/dL (ref 0.0–40.0)

## 2020-11-11 ENCOUNTER — Ambulatory Visit (INDEPENDENT_AMBULATORY_CARE_PROVIDER_SITE_OTHER): Payer: Managed Care, Other (non HMO) | Admitting: Family

## 2020-11-11 ENCOUNTER — Other Ambulatory Visit: Payer: Self-pay

## 2020-11-11 VITALS — BP 132/88 | HR 78 | Temp 98.2°F | Resp 18 | Ht 70.0 in | Wt 207.4 lb

## 2020-11-11 DIAGNOSIS — L0390 Cellulitis, unspecified: Secondary | ICD-10-CM | POA: Diagnosis not present

## 2020-11-11 DIAGNOSIS — Z23 Encounter for immunization: Secondary | ICD-10-CM | POA: Diagnosis not present

## 2020-11-11 DIAGNOSIS — R002 Palpitations: Secondary | ICD-10-CM | POA: Diagnosis not present

## 2020-11-11 DIAGNOSIS — F40243 Fear of flying: Secondary | ICD-10-CM | POA: Diagnosis not present

## 2020-11-11 DIAGNOSIS — E785 Hyperlipidemia, unspecified: Secondary | ICD-10-CM

## 2020-11-11 LAB — COMPREHENSIVE METABOLIC PANEL
ALT: 28 U/L (ref 0–53)
AST: 21 U/L (ref 0–37)
Albumin: 4.7 g/dL (ref 3.5–5.2)
Alkaline Phosphatase: 49 U/L (ref 39–117)
BUN: 14 mg/dL (ref 6–23)
CO2: 29 mEq/L (ref 19–32)
Calcium: 9.7 mg/dL (ref 8.4–10.5)
Chloride: 103 mEq/L (ref 96–112)
Creatinine, Ser: 1.04 mg/dL (ref 0.40–1.50)
GFR: 91.33 mL/min (ref >60.00)
Glucose, Bld: 84 mg/dL (ref 70–99)
Potassium: 4.3 mEq/L (ref 3.5–5.1)
Sodium: 140 mEq/L (ref 135–145)
Total Bilirubin: 0.7 mg/dL (ref 0.2–1.2)
Total Protein: 7.2 g/dL (ref 6.0–8.3)

## 2020-11-11 LAB — CBC WITH DIFFERENTIAL/PLATELET
Basophils Absolute: 0 10*3/uL (ref 0.0–0.1)
Basophils Relative: 0.2 % (ref 0.0–3.0)
Eosinophils Absolute: 0.1 10*3/uL (ref 0.0–0.7)
Eosinophils Relative: 1.1 % (ref 0.0–5.0)
HCT: 46.3 % (ref 39.0–52.0)
Hemoglobin: 15.7 g/dL (ref 13.0–17.0)
Lymphocytes Relative: 32.4 % (ref 12.0–46.0)
Lymphs Abs: 2.5 10*3/uL (ref 0.7–4.0)
MCHC: 34 g/dL (ref 30.0–36.0)
MCV: 86.7 fl (ref 78.0–100.0)
Monocytes Absolute: 0.4 10*3/uL (ref 0.1–1.0)
Monocytes Relative: 5.7 % (ref 3.0–12.0)
Neutro Abs: 4.7 10*3/uL (ref 1.4–7.7)
Neutrophils Relative %: 60.6 % (ref 43.0–77.0)
Platelets: 214 10*3/uL (ref 150.0–400.0)
RBC: 5.34 Mil/uL (ref 4.22–5.81)
RDW: 12.9 % (ref 11.5–15.5)
WBC: 7.8 10*3/uL (ref 4.0–10.5)

## 2020-11-11 LAB — TSH: TSH: 1.29 u[IU]/mL (ref 0.35–5.50)

## 2020-11-11 MED ORDER — MUPIROCIN 2 % EX OINT: 1.0000 "application " | TOPICAL_OINTMENT | Freq: Two times a day (BID) | CUTANEOUS | 0 refills | Status: DC

## 2020-11-11 MED ORDER — LORAZEPAM 0.5 MG PO TABS: ORAL_TABLET | ORAL | 1 refills | Status: DC

## 2020-11-11 NOTE — Progress Notes (Signed)
Bradley Lawson is a 40 y.o. male with the following history as recorded in Epic[employer]:  Patient Active Problem List   Diagnosis Date Noted   History of esophageal ulcer 06/30/2020   Hx of adenomatous colonic polyps 06/30/2020   Loose bowel movements 06/30/2020   History of Clostridioides difficile infection 12/01/2019   Adenomatous colon polyp 12/01/2019   Chronic nonintractable headache 11/17/2014    Current Outpatient Medications  Medication Sig Dispense Refill   clindamycin (CLEOCIN T) 1 % external solution Apply topically.     loratadine (CLARITIN) 10 MG tablet Take by mouth.     LORazepam (ATIVAN) 0.5 MG tablet 1 tablet q 30-45 minutes prior to flight as needed 20 tablet 1   mupirocin ointment (BACTROBAN) 2 % Apply 1 application topically 2 (two) times daily. 22 g 0   QULIPTA 60 MG TABS TAKE ONE TABLET BY MOUTH EVERY DAY 30 tablet 5   Ubrogepant (UBRELVY) 100 MG TABS Take 1 tablet by mouth as needed (May repeat in 2 hours.  Maximum 2 tablets in 24 hours.). 16 tablet 5   Current Facility-Administered Medications  Medication Dose Route Frequency Provider Last Rate Last Admin   0.9 %  sodium chloride infusion  500 mL Intravenous Once Mansouraty, Telford Nab., MD        Allergies: Clindamycin/lincomycin and Sulfa antibiotics  Past Medical History:  Diagnosis Date   Allergy    Anxiety    Colon polyps    Migraine headache    Osteoarthritis    shoulder   Shoulder pain    left    Past Surgical History:  Procedure Laterality Date   COLONOSCOPY     UPPER GASTROINTESTINAL ENDOSCOPY      Family History  Problem Relation Age of Onset   Colon polyps Mother    Irritable bowel syndrome Mother    Anemia Mother    Diverticulosis Father    Colon polyps Father    Irritable bowel syndrome Brother    Colon polyps Maternal Grandmother    Heart disease Maternal Grandfather    Heart attack Maternal Grandfather    Heart disease Paternal Grandmother        CAD   Stomach cancer  Paternal Grandfather    Pancreatic cancer Neg Hx    Esophageal cancer Neg Hx    Liver disease Neg Hx    Colon cancer Neg Hx    Inflammatory bowel disease Neg Hx    Rectal cancer Neg Hx     Social History   Tobacco Use   Smoking status: Never   Smokeless tobacco: Never  Substance Use Topics   Alcohol use: Yes    Comment: 2-3 on weekends    Subjective:   Presents with concerns for palpitations- had sensation of irregular heartbeat while lying down with daughter; first episode occurred 1 month ago/ 2nd episode 2 weeks ago; symptoms lasted for a few seconds and then resolved; admits that stress level has been higher recently- has had panic attacks in the past;   Also concerned about worsening folliculitis- due to see dermatology next week; had painful lesion on abdomen last week- has been treating with topical Clindamycin;   Would also like to discuss recent lipid  panel; has found out that both parents are taking statins;    Objective:  Vitals:   11/11/20 0839  BP: 132/88  Pulse: 78  Resp: 18  Temp: 98.2 F (36.8 C)  TempSrc: Oral  SpO2: 98%  Weight: 207 lb 6.4 oz (94.1 kg)  Height: '5\' 10"'$  (1.778 m)    General: Well developed, well nourished, in no acute distress  Skin : Warm and dry. Healing lesion c/w folliculitis noted on left upper abdomen Head: Normocephalic and atraumatic  Lungs: Respirations unlabored; clear to auscultation bilaterally without wheeze, rales, rhonchi  CVS exam: normal rate and regular rhythm.  Neurologic: Alert and oriented; speech intact; face symmetrical; moves all extremities well; CNII-XII intact without focal deficit   Assessment:  1. Palpitation   2. Need for influenza vaccination   3. Cellulitis, unspecified cellulitis site   4. Hyperlipidemia, unspecified hyperlipidemia type   5. Anxiety with flying     Plan:  EKG shows sinus rhythm- unchanged from previous exams; check Cbc, CMP, TSH today; suspect anxiety component due to personal/  family stressors; will refer to cardiology for holter monitor placement.  Flu shot given; Wound culture obtained; trial of Bactroban nasal cream bid x 5 days; Reviewed labs- will plan to re-check labs in 6 months; he is planning to make dietary changes per GI recommendation and will see how this affects lipid panel; to consider statin if still elevated in 6 months. Refill on Ativan which he has used in the past;   This visit occurred during the SARS-CoV-2 public health emergency.  Safety protocols were in place, including screening questions prior to the visit, additional usage of staff PPE, and extensive cleaning of exam room while observing appropriate contact time as indicated for disinfecting solutions.    No follow-ups on file.  Orders Placed This Encounter  Procedures   Wound culture    Order Specific Question:   Source    Answer:   abdominal wall   Flu Vaccine QUAD 6+ mos PF IM (Fluarix Quad PF)   CBC with Differential/Platelet   Comp Met (CMET)   TSH   Ambulatory referral to Cardiology    Referral Priority:   Routine    Referral Type:   Consultation    Referral Reason:   Specialty Services Required    Requested Specialty:   Cardiology    Number of Visits Requested:   1   EKG 12-Lead    Requested Prescriptions   Signed Prescriptions Disp Refills   mupirocin ointment (BACTROBAN) 2 % 22 g 0    Sig: Apply 1 application topically 2 (two) times daily.   LORazepam (ATIVAN) 0.5 MG tablet 20 tablet 1    Sig: 1 tablet q 30-45 minutes prior to flight as needed

## 2020-11-12 DIAGNOSIS — F419 Anxiety disorder, unspecified: Secondary | ICD-10-CM | POA: Insufficient documentation

## 2020-11-12 DIAGNOSIS — E785 Hyperlipidemia, unspecified: Secondary | ICD-10-CM

## 2020-11-12 DIAGNOSIS — K635 Polyp of colon: Secondary | ICD-10-CM | POA: Insufficient documentation

## 2020-11-12 DIAGNOSIS — T7840XA Allergy, unspecified, initial encounter: Secondary | ICD-10-CM | POA: Insufficient documentation

## 2020-11-12 DIAGNOSIS — M1990 Unspecified osteoarthritis, unspecified site: Secondary | ICD-10-CM | POA: Insufficient documentation

## 2020-11-12 DIAGNOSIS — G43909 Migraine, unspecified, not intractable, without status migrainosus: Secondary | ICD-10-CM | POA: Insufficient documentation

## 2020-11-12 DIAGNOSIS — M25519 Pain in unspecified shoulder: Secondary | ICD-10-CM | POA: Insufficient documentation

## 2020-11-12 HISTORY — DX: Hyperlipidemia, unspecified: E78.5

## 2020-11-15 LAB — WOUND CULTURE
MICRO NUMBER:: 12439864
RESULT:: NO GROWTH
SPECIMEN QUALITY:: ADEQUATE

## 2020-11-16 ENCOUNTER — Ambulatory Visit (INDEPENDENT_AMBULATORY_CARE_PROVIDER_SITE_OTHER): Payer: Managed Care, Other (non HMO)

## 2020-11-16 ENCOUNTER — Ambulatory Visit (INDEPENDENT_AMBULATORY_CARE_PROVIDER_SITE_OTHER): Payer: Managed Care, Other (non HMO) | Admitting: Cardiology

## 2020-11-16 ENCOUNTER — Other Ambulatory Visit: Payer: Self-pay

## 2020-11-16 ENCOUNTER — Encounter: Payer: Self-pay | Admitting: Cardiology

## 2020-11-16 VITALS — BP 136/92 | HR 76 | Ht 70.0 in | Wt 210.0 lb

## 2020-11-16 DIAGNOSIS — E782 Mixed hyperlipidemia: Secondary | ICD-10-CM

## 2020-11-16 DIAGNOSIS — R0609 Other forms of dyspnea: Secondary | ICD-10-CM | POA: Diagnosis not present

## 2020-11-16 DIAGNOSIS — R002 Palpitations: Secondary | ICD-10-CM

## 2020-11-16 NOTE — Addendum Note (Signed)
Addended by: Senaida Ores on: 11/16/2020 02:35 PM   Modules accepted: Orders

## 2020-11-16 NOTE — Progress Notes (Signed)
Cardiology Consultation:    Date:  11/16/2020   ID:  Bradley Lawson, DOB 1980/11/04, MRN [id number]  PCP:  Marrian Salvage, [place]  Cardiologist:  Jenne Campus, MD   Referring MD: Marrian Salvage,*   Chief Complaint  Patient presents with   Palpitations    History of Present Illness:    Bradley Lawson is a 40 y.o. male who is being seen today for the evaluation of palpitations at the request of Marrian Salvage,*.  Past medical history significant for migraine headaches, dyslipidemia, history of coronary artery disease but not premature.  He was referred to [place] because of palpitations.  He apparently described 2 episode of palpitations 1 time in which happened about a month ago he lay down in the bed with his child and then he started feeling strength sensation in the chest he had difficulty describing it but eventually the culmination of sensation was strong powerful beats in his chest then gradually subsided he is really getting scared he did not have sweating, but may have some shortness of breath.  He actually went to do a different room to walk up his wife told her that he does have a problem and everything went back to normal interestingly when he stood up and things got much better.  Second episode happened about 2 weeks ago it was exactly the same kind of circumstances he was laying down with his child and he started having the same sensation.  Overall he seems to be doing well.  He exercise on the regular basis he is lifting weights.  He denies have any chest pain tightness squeezing pressure burning chest does have some shortness of breath with exertion.  Recently also he was discovered to have significant hyperlipidemia with LDL of 169.  He does not smoke does not drink alcohol except socially.  Past Medical History:  Diagnosis Date   Abnormal CT of the chest 12/13/2015   Formatting of this note might be different from the original. 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   loratadine (CLARITIN) 10 MG tablet Take 10 mg by mouth daily. Seasonal allergies   LORazepam (ATIVAN) 0.5 MG tablet 1 tablet q 30-45 minutes prior to flight as needed (Patient taking differently: Take 0.5 mg by mouth See admin instructions. 1 tablet q 30-45 minutes prior to flight as needed)   QULIPTA 60 MG TABS TAKE ONE TABLET BY MOUTH EVERY DAY (Patient taking differently: Take 1 tablet by mouth daily.)   Ubrogepant (UBRELVY) 100 MG TABS Take 1 tablet by mouth as needed (May repeat in 2 hours.  Maximum 2 tablets in 24 hours.).   Current Facility-Administered Medications for the 11/16/20 encounter (Office Visit) with Park Liter, MD  Medication   0.9 %  sodium chloride infusion     Allergies:   Clindamycin/lincomycin and Sulfa antibiotics   Social History   Socioeconomic History  Marital status: Married    Spouse name: Not on file   Number of children: 2   Years of education: 27   Highest education level: Not on file  Occupational History   Occupation: accountant  Tobacco Use   Smoking status: Never   Smokeless tobacco: Never  Vaping Use   Vaping Use: Never used  Substance and Sexual Activity   Alcohol use: Yes    Comment: 2-3 on weekends   Drug use: Never   Sexual activity: Yes    Partners: Female  Other Topics Concern   Not on file  Social History Narrative   Right handed   Drinks caffeine    Two story home   Social Determinants  of Health   Financial Resource Strain: Not on file  Food Insecurity: Not on file  Transportation Needs: Not on file  Physical Activity: Not on file  Stress: Not on file  Social Connections: Not on file     Family History: The patient's family history includes Anemia in his mother; Colon polyps in his father, maternal grandmother, and mother; Diverticulosis in his father; Heart attack in his maternal grandfather; Heart disease in his maternal grandfather and paternal grandmother; Irritable bowel syndrome in his brother and mother; Stomach cancer in his paternal grandfather. There is no history of Pancreatic cancer, Esophageal cancer, Liver disease, Colon cancer, Inflammatory bowel disease, or Rectal cancer. ROS:   Please see the history of present illness.    All 14 point review of systems negative except as described per history of present illness.  EKGs/Labs/Other Studies Reviewed:    The following studies were reviewed today:   EKG:  EKG is  ordered today.  The ekg ordered today demonstrates normal sinus rhythm normal P interval normal QS complex duration morphology no ST segment changes  Recent Labs: 11/11/2020: ALT 28; BUN 14; Creatinine, Ser 1.04; Hemoglobin 15.7; Platelets 214.0; Potassium 4.3; Sodium 140; TSH 1.29  Recent Lipid Panel    Component Value Date/Time   CHOL 236 (H) 11/09/2020 0911   TRIG 120.0 11/09/2020 0911   HDL 42.60 11/09/2020 0911   CHOLHDL 6 11/09/2020 0911   VLDL 24.0 11/09/2020 0911   LDLCALC 169 (H) 11/09/2020 0911    Physical Exam:    VS:  BP (!) 136/92 (BP Location: Right Arm, Patient Position: Sitting)   Pulse 76   Ht 5\' 10"  (1.778 m)   Wt 210 lb (95.3 kg)   SpO2 99%   BMI 30.13 kg/m     Wt Readings from Last 3 Encounters:  11/16/20 210 lb (95.3 kg)  11/11/20 207 lb 6.4 oz (94.1 kg)  10/01/20 209 lb (94.8 kg)     GEN:  Well nourished, well developed in no acute distress HEENT: Normal NECK: No JVD; No carotid bruits LYMPHATICS: No  lymphadenopathy CARDIAC: RRR, no murmurs, no rubs, no gallops RESPIRATORY:  Clear to auscultation without rales, wheezing or rhonchi  ABDOMEN: Soft, non-tender, non-distended MUSCULOSKELETAL:  No edema; No deformity  SKIN: Warm and dry NEUROLOGIC:  Alert and oriented x 3 PSYCHIATRIC:  Normal affect   ASSESSMENT:    1. Mixed hyperlipidemia   2. Palpitations   3. Dyspnea on exertion    PLAN:    In order of problems listed above:  Palpitations.  I will ask him to wear Zio patch for 2 weeks to see if he got any significant arrhythmia.  I will ask him also to have an echocardiogram to assess left ventricular ejection fraction. Dyslipidemia, clearly very  elevated with he need to stick with the diet as well as exercises.  In the future we may consider medications.   Medication Adjustments/Labs and Tests Ordered: Current medicines are reviewed at length with the patient today.  Concerns regarding medicines are outlined above.  No orders of the defined types were placed in this encounter.  No orders of the defined types were placed in this encounter.   Signed, Park Liter, MD, [facility]. 11/16/2020 2:20 PM    [facility]

## 2020-11-16 NOTE — Addendum Note (Signed)
Addended by: Senaida Ores on: 11/16/2020 02:29 PM   Modules accepted: Orders

## 2020-11-16 NOTE — Patient Instructions (Signed)
Medication Instructions:  Your physician recommends that you continue on your current medications as directed. Please refer to the Current Medication list given to you today.  *If you need a refill on your cardiac medications before your next appointment, please call your pharmacy*   Lab Work: None. If you have labs (blood work) drawn today and your tests are completely normal, you will receive your results only by: [place] (if you have MyChart) OR A paper copy in the mail If you have any lab test that is abnormal or we need to change your treatment, we will call you to review the results.   Testing/Procedures: A zio monitor was ordered today. It will remain on for 14 days. You will then return monitor and event diary in provided box. It takes 1-2 weeks for report to be downloaded and returned to [place]. We will call you with the results. If monitor falls off or has orange flashing light, please call Zio for further instructions.   Your physician has requested that you have an echocardiogram. Echocardiography is a painless test that uses sound waves to create images of your heart. It provides your doctor with information about the size and shape of your heart and how well your heart's chambers and valves are working. This procedure takes approximately one hour. There are no restrictions for this procedure.    Follow-Up: At [facility], you and your health needs are our priority.  As part of our continuing mission to provide you with exceptional heart care, we have created designated Provider Care Teams.  These Care Teams include your primary Cardiologist (physician) and Advanced Practice Providers (APPs -  Physician Assistants and Nurse Practitioners) who all work together to provide you with the care you need, when you need it.  We recommend signing up for the patient portal called "MyChart".  Sign up information is provided on this After Visit Summary.  MyChart is used to connect  with patients for Virtual Visits (Telemedicine).  Patients are able to view lab/test results, encounter notes, upcoming appointments, etc.  Non-urgent messages can be sent to your provider as well.   To learn more about what you can do with MyChart, go to [email or web address].    Your next appointment:   2 month(s)  The format for your next appointment:   In Person  Provider:   Jenne Campus, MD   Other Instructions  Echocardiogram An echocardiogram is a test that uses sound waves (ultrasound) to produce images of the heart. Images from an echocardiogram can provide important information about: Heart size and shape. The size and thickness and movement of your heart's walls. Heart muscle function and strength. Heart valve function or if you have stenosis. Stenosis is when the heart valves are too narrow. If blood is flowing backward through the heart valves (regurgitation). A tumor or infectious growth around the heart valves. Areas of heart muscle that are not working well because of poor blood flow or injury from a heart attack. Aneurysm detection. An aneurysm is a weak or damaged part of an artery wall. The wall bulges out from the normal force of blood pumping through the body. Tell a health care provider about: Any allergies you have. All medicines you are taking, including vitamins, herbs, eye drops, creams, and over-the-counter medicines. Any blood disorders you have. Any surgeries you have had. Any medical conditions you have. Whether you are pregnant or may be pregnant. What are the risks? Generally, this is a safe test. However, problems  may occur, including an allergic reaction to dye (contrast) that may be used during the test. What happens before the test? No specific preparation is needed. You may eat and drink normally. What happens during the test?  You will take off your clothes from the waist up and put on a hospital gown. Electrodes or electrocardiogram  (ECG)patches may be placed on your chest. The electrodes or patches are then connected to a device that monitors your heart rate and rhythm. You will lie down on a table for an ultrasound exam. A gel will be applied to your chest to help sound waves pass through your skin. A handheld device, called a transducer, will be pressed against your chest and moved over your heart. The transducer produces sound waves that travel to your heart and bounce back (or "echo" back) to the transducer. These sound waves will be captured in real-time and changed into images of your heart that can be viewed on a video monitor. The images will be recorded on a computer and reviewed by your health care provider. You may be asked to change positions or hold your breath for a short time. This makes it easier to get different views or better views of your heart. In some cases, you may receive contrast through an IV in one of your veins. This can improve the quality of the pictures from your heart. The procedure may vary among health care providers and hospitals. What can I expect after the test? You may return to your normal, everyday life, including diet, activities, and medicines, unless your health care provider tells you not to do that. Follow these instructions at home: It is up to you to get the results of your test. Ask your health care provider, or the department that is doing the test, when your results will be ready. Keep all follow-up visits. This is important. Summary An echocardiogram is a test that uses sound waves (ultrasound) to produce images of the heart. Images from an echocardiogram can provide important information about the size and shape of your heart, heart muscle function, heart valve function, and other possible heart problems. You do not need to do anything to prepare before this test. You may eat and drink normally. After the echocardiogram is completed, you may return to your normal, everyday  life, unless your health care provider tells you not to do that. This information is not intended to replace advice given to you by your health care provider. Make sure you discuss any questions you have with your health care provider. Document Revised: 09/23/2019 Document Reviewed: 09/23/2019 Elsevier Patient Education  2022 [employer].

## 2020-11-29 ENCOUNTER — Ambulatory Visit (HOSPITAL_BASED_OUTPATIENT_CLINIC_OR_DEPARTMENT_OTHER): Payer: Managed Care, Other (non HMO)

## 2020-12-20 ENCOUNTER — Telehealth: Payer: Self-pay | Admitting: *Deleted

## 2020-12-20 NOTE — Telephone Encounter (Signed)
Please review. [place] for [employer]?

## 2020-12-22 NOTE — Telephone Encounter (Signed)
Noted  

## 2021-01-04 ENCOUNTER — Ambulatory Visit (AMBULATORY_SURGERY_CENTER): Payer: Managed Care, Other (non HMO) | Admitting: *Deleted

## 2021-01-04 ENCOUNTER — Other Ambulatory Visit: Payer: Self-pay

## 2021-01-04 VITALS — Ht 70.0 in | Wt 205.0 lb

## 2021-01-04 DIAGNOSIS — K31A0 Gastric intestinal metaplasia, unspecified: Secondary | ICD-10-CM

## 2021-01-04 NOTE — Progress Notes (Signed)
Virtual pre visit completed.  Instructions forwarded through [place].    No egg or soy allergy known to patient  No issues known to pt with past sedation with any surgeries or procedures Patient denies ever being told they had issues or difficulty with intubation  No FH of Malignant Hyperthermia Pt is not on diet pills Pt is not on  home 02  Pt is not on blood thinners  Pt denies issues with constipation  No A fib or A flutter  Pt is fully vaccinated  for Covid     Due to the COVID-19 pandemic we are asking patients to follow certain guidelines in [place] and the [place]   Pt aware of COVID protocols and [facility] guidelines

## 2021-01-11 ENCOUNTER — Telehealth: Payer: Self-pay

## 2021-01-11 NOTE — Telephone Encounter (Signed)
The pt has been advised and recall has been entered for 09/2021 for EGD.

## 2021-01-11 NOTE — Telephone Encounter (Signed)
-----   Message from Irving Copas., MD sent at 01/11/2021 12:08 PM EST ----- Regarding: RE: peer to peer Lesly Rubenstein, Thank you for this update. Not ideal. But, I am fine to wait the 1 year before repeat EGD is allowed. Based on clinical practice it would be more ideal to have as much information now to then spread out the follow-up but in either case it is okay to wait until the 1 year mark. Please let the patient know that I am okay with waiting until next year before we do his EGD with gastric mapping. Thanks. GM ----- Message ----- From: Ronalee Red I Sent: 01/11/2021  12:07 PM EST To: Timothy Lasso, RN, Irving Copas., MD Subject: peer to peer                                   Good afternoon Dr Rush Landmark this pt's MRI was denied the reasons are: Why: We reviewed information from DR. GABRIEL MANSOURATY, your health plan and any policies and guidelines needed to reach this decision. We found the service requested is not medically necessary in your case. This decision was based on the following: Based on [employer] Gastrointestinal Endoscopic Procedure Guidelines - Esophagogastroduodenoscopy (EGD) Section(s): EGD 1.6: Gastric Intestinal Metaplasia (GIM), we cannot approve this request. Your healthcare provider told us that you may have a problem within the lining of your stomach. The request cannot be approved because: The requested test or one like is only supported if it has been one year since your last study for this problem. Your records do not show that this applies to you.   When are you available to do a peer to peer? Patient is scheduled for 12/7

## 2021-01-19 ENCOUNTER — Encounter: Payer: Managed Care, Other (non HMO) | Admitting: Gastroenterology

## 2021-03-01 NOTE — Progress Notes (Signed)
NEUROLOGY FOLLOW UP OFFICE NOTE  Bradley Lawson [id number]  Assessment/Plan:   Migraine without aura, without status migrainosus, not intractable complicated by medication-overuse.   Migraine prevention:  Qulipta 60mg  daily Migraine rescue:  Ubrelvy 100mg  Limit use of pain relievers to no more than 2 days out of week to prevent risk of rebound or medication-overuse headache. Keep headache diary Follow up 6 months.     Subjective:  Bradley Lawson is a 41 year old right-handed male who follows up for headaches.   UPDATE: Intensity:  mild Duration:  1 hour with ibuprofen Frequency:  once a week or less  Last few months, tinnitus is worse.  Had a couple of episodes of palpitations late last year.  Evaluated by cardiology.  Negative workup.  Told it may have been a panic attack.   Current NSAIDS/analgesics:  none Current triptans:  none Current ergotamine:  none Current anti-emetic:  none Current muscle relaxants:  none Current Antihypertensive medications:  none Current Antidepressant medications:  none Current Anticonvulsant medications:  none Current anti-CGRP:  Qulipta 60mg  QD, Ubrelvy 100mg  (not needed for a month) Current Vitamins/Herbal/Supplements:  none Current Antihistamines/Decongestants:  Claritin Other therapy:  none Hormone/birth control:  none Other medications:  Ambien   Caffeine:  2 cups of coffee daily.  1 can Coke Zero daily Diet:  36 oz water daily.  Does not skip meals Exercise:  Routine.  Lifts weights Depression:  no; Anxiety:  A little bit Other pain:  no Sleep:  good  CMP from September was normal.     HISTORY:  He reports history of migraines since childhood.  He has been on propranolol for many years but recently doesn't think it has been effective.  They are moderate bifrontal pressure ache.  Some photophobia and phonophobia.  No nausea, vomiting, visual disturbance, numbness and weakness. They last 3-4 hours.  They occur every other  day.  Takes ibuprofen 2 to 3 tablets almost daily.  Triggers include noise, certain smells (perfumes, flowers), stress and sometimes exertion (lifting weights).  Applying pressure on the temples help relieve it.     Previous imaging: 01/23/2013 MRI BRAIN W WO:  Normal study 06/11/2017 MRI BRAIN/PITUITARY W WO:  Normal MRI of the brain and pituitary   Past NSAIDS/analgesics:  Tylenol, Excedrin, ibuprofen Past abortive triptans:  Sumatriptan, Zomig-MLT Past abortive ergotamine:  none Past muscle relaxants:  none Past anti-emetic:  none Past antihypertensive medications:  propranolol ER 160mg  Past antidepressant medications:  none Past anticonvulsant medications:  topiramate (worsened tinnitus), zonisamide Past anti-CGRP:  none Past vitamins/Herbal/Supplements:  none Past antihistamines/decongestants:  Flonase Other past therapies:  none     Family history of headache:  Father, daughter  PAST MEDICAL HISTORY: Past Medical History:  Diagnosis Date   Abnormal CT of the chest 12/13/2015   Formatting of this note might be different from the original. 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     loratadine (  CLARITIN) 10 MG tablet Take 10 mg by mouth daily. Seasonal allergies     LORazepam (ATIVAN) 0.5 MG tablet 1 tablet q 30-45 minutes prior to flight as needed (Patient taking differently: Take 0.5 mg by mouth See admin instructions. 1 tablet q 30-45 minutes prior to flight as needed) 20 tablet  1   QULIPTA 60 MG TABS TAKE ONE TABLET BY MOUTH EVERY DAY (Patient taking differently: Take 1 tablet by mouth daily.) 30 tablet 5   Ubrogepant (UBRELVY) 100 MG TABS Take 1 tablet by mouth as needed (May repeat in 2 hours.  Maximum 2 tablets in 24 hours.). 16 tablet 5   Current Facility-Administered Medications on File Prior to Visit  Medication Dose Route Frequency Provider Last Rate Last Admin   0.9 %  sodium chloride infusion  500 mL Intravenous Once Mansouraty, Telford Nab., MD        ALLERGIES: Allergies  Allergen Reactions   Clindamycin/Lincomycin    Sulfa Antibiotics     FAMILY HISTORY: Family History  Problem Relation Age of Onset   Colon polyps Mother    Irritable bowel syndrome Mother    Anemia Mother    Diverticulosis Father    Colon polyps Father    Irritable bowel syndrome Brother    Colon polyps Maternal Grandmother    Heart disease Maternal Grandfather    Heart attack Maternal Grandfather    Heart disease Paternal Grandmother        CAD   Stomach cancer Paternal Grandfather    Pancreatic cancer Neg Hx    Esophageal cancer Neg Hx    Liver disease Neg Hx    Colon cancer Neg Hx    Inflammatory bowel disease Neg Hx    Rectal cancer Neg Hx       Objective:  Blood pressure 128/79, pulse 87, height 5\' 9"  (1.753 m), weight 205 lb 6.4 oz (93.2 kg), SpO2 98 %. General: No acute distress.  Patient appears well-groomed.   Head:  Normocephalic/atraumatic Eyes:  Fundi examined but not visualized Neck: supple, no paraspinal tenderness, full range of motion Heart:  Regular rate and rhythm Lungs:  Clear to auscultation bilaterally Back: No paraspinal tenderness Neurological Exam: alert and oriented to person, place, and time.  Speech fluent and not dysarthric, language intact.  CN II-XII intact. Bulk and tone normal, muscle strength 5/5 throughout.  Sensation to light touch intact.  Deep tendon reflexes 2+ throughout, toes downgoing.  Finger to nose testing intact.  Gait  normal, Romberg negative.   Metta Clines, DO  CC: Marrian Salvage, [place]

## 2021-03-03 ENCOUNTER — Ambulatory Visit: Payer: Managed Care, Other (non HMO) | Admitting: Cardiology

## 2021-03-08 ENCOUNTER — Encounter: Payer: Self-pay | Admitting: Neurology

## 2021-03-08 ENCOUNTER — Ambulatory Visit (INDEPENDENT_AMBULATORY_CARE_PROVIDER_SITE_OTHER): Payer: Managed Care, Other (non HMO) | Admitting: Neurology

## 2021-03-08 ENCOUNTER — Other Ambulatory Visit: Payer: Self-pay

## 2021-03-08 VITALS — BP 128/79 | HR 87 | Ht 69.0 in | Wt 205.4 lb

## 2021-03-08 DIAGNOSIS — G43009 Migraine without aura, not intractable, without status migrainosus: Secondary | ICD-10-CM | POA: Diagnosis not present

## 2021-03-08 NOTE — Patient Instructions (Signed)
Continue Qulipta Ibuprofen or Ubrelvy as needed. Limit use of pain relievers to no more than 2 days out of week to prevent risk of rebound or medication-overuse headache.

## 2021-03-14 ENCOUNTER — Other Ambulatory Visit: Payer: Self-pay | Admitting: Neurology

## 2021-07-07 NOTE — Progress Notes (Signed)
I, Bradley Lawson, LAT, ATC, am serving as scribe for Dr. Lynne Lawson.  Bradley Lawson is a 41 y.o. male who presents to [place] at [facility] today for L shoulder pain.  He was last seen by Dr. Georgina Snell on 02/02/20 for f/u of a R gastroc strain/tear.  Today, pt reports L shoulder pain that's chronic in nature, but worsened over the last 2-3 weeks. 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  Over the last 2-3 weeks, the L shoulder pain has become constant. Locates pain to the anterior aspect of the L shoulder. Pt enjoys playing playing tennis. Pt is R-hand dominate.   Radiating pain: no L shoulder mechanical symptoms: no Aggravating factors: overhead, horz ABD Treatments tried:   Pt also wanted to f/u about his R calf strain. Pt c/o R calf getting "tight" when doing activity.  Patient suffered a calf tear December 2021 with a hematoma.  Pertinent review of systems: No fevers or chills  Relevant historical information: History of right calf tear.   Exam:  BP (!) 148/96   Pulse 94   Ht '5\' 9"'$  (1.753 m)   Wt 210 lb (95.3 kg)   SpO2 97%   BMI 31.01 kg/m  General: Well Developed, well nourished, and in no acute distress.   MSK: Left shoulder: Normal. Nontender. Range of motion abduction full pain with abduction.  Internal rotation lumbar spine external rotation fall. Strength abduction 4/5.  Internal rotation 5/5 external rotation 5/5. Positive Hawkins and Neer's test.  Positive empty can test. Negative Yergason's and speeds test.  Right calf normal. Minimally tender palpation medial head of the gastrocnemius Normal calf motion and strength.    Lab and Radiology Results  Diagnostic Limited MSK Ultrasound of: Left shoulder and right calf Left shoulder: Biceps tendon is intact normal. Subscapularis tendon intact Supraspinatus tendon is intact with mild subacromial bursitis. Infraspinatus tendon is intact. AC joint  mild degenerative appearing  Right calf: Medial head previous tear is visible with scar formation present without remaining hematoma or seroma.  Impression: Left shoulder: Subacromial bursitis.  Right calf prior calf tear now healed   X-ray images left shoulder obtained today personally and independently interpreted No severe DJD.  No acute fractures are present. Await formal radiology review   Assessment and Plan: 41 y.o. male with left shoulder pain thought to be due to subacromial bursitis. Plan for physical therapy.  If not improved consider injection.  Right calf pain.  Patient has continued mild intermittent calf irritation.  He had a large calf tear a year and a half ago and has some continued pain as result.  He would do well with dedicated exercise and compression.  If not improving sufficiently with some physical therapy as above modalities such as nitroglycerin patch protocol, shockwave therapy, or even PRP could be helpful.  Check in 8 weeks. PDMP not reviewed this encounter. Orders Placed This Encounter  Procedures   [place] LIMITED JOINT SPACE STRUCTURES UP LEFT(NO LINKED CHARGES)    Order Specific Question:   Reason for Exam (SYMPTOM  OR DIAGNOSIS REQUIRED)    Answer:   left shoulder pain    Order Specific Question:   Preferred imaging location?    Answer:   [place]   DG Shoulder Left    Standing Status:   Future    Number of Occurrences:   1    Standing Expiration Date:   08/08/2021  Order Specific Question:   Reason for Exam (SYMPTOM  OR DIAGNOSIS REQUIRED)    Answer:   left shoulder pain    Order Specific Question:   Preferred imaging location?    Answer:   [facility]   Ambulatory referral to Physical Therapy    Referral Priority:   Routine    Referral Type:   Physical Medicine    Referral Reason:   Specialty Services Required    Requested Specialty:   Physical Therapy    Number of Visits Requested:   1   No orders of the  defined types were placed in this encounter.    Discussed warning signs or symptoms. Please see discharge instructions. Patient expresses understanding.   The above documentation has been reviewed and is accurate and complete Bradley Lawson, M.D.

## 2021-07-08 ENCOUNTER — Ambulatory Visit (INDEPENDENT_AMBULATORY_CARE_PROVIDER_SITE_OTHER): Payer: Managed Care, Other (non HMO)

## 2021-07-08 ENCOUNTER — Ambulatory Visit (INDEPENDENT_AMBULATORY_CARE_PROVIDER_SITE_OTHER): Payer: Managed Care, Other (non HMO) | Admitting: Family Medicine

## 2021-07-08 ENCOUNTER — Ambulatory Visit: Payer: Self-pay

## 2021-07-08 VITALS — BP 148/96 | HR 94 | Ht 69.0 in | Wt 210.0 lb

## 2021-07-08 DIAGNOSIS — M25512 Pain in left shoulder: Secondary | ICD-10-CM | POA: Diagnosis not present

## 2021-07-08 DIAGNOSIS — M79661 Pain in right lower leg: Secondary | ICD-10-CM

## 2021-07-08 DIAGNOSIS — G8929 Other chronic pain: Secondary | ICD-10-CM | POA: Diagnosis not present

## 2021-07-08 NOTE — Patient Instructions (Addendum)
Thank you for coming in today.   I've referred you to Physical Therapy.  Let us know if you don't hear from them in one week.   Please get an Xray today before you leave   Recheck in 8 weeks

## 2021-07-12 NOTE — Progress Notes (Signed)
Left shoulder x-ray looks normal to radiology

## 2021-07-18 ENCOUNTER — Ambulatory Visit (INDEPENDENT_AMBULATORY_CARE_PROVIDER_SITE_OTHER): Payer: Managed Care, Other (non HMO) | Admitting: Physical Therapy

## 2021-07-18 ENCOUNTER — Encounter: Payer: Self-pay | Admitting: Physical Therapy

## 2021-07-18 DIAGNOSIS — M25512 Pain in left shoulder: Secondary | ICD-10-CM

## 2021-07-18 DIAGNOSIS — G8929 Other chronic pain: Secondary | ICD-10-CM | POA: Diagnosis not present

## 2021-07-18 DIAGNOSIS — M6281 Muscle weakness (generalized): Secondary | ICD-10-CM

## 2021-07-18 DIAGNOSIS — M79661 Pain in right lower leg: Secondary | ICD-10-CM | POA: Diagnosis not present

## 2021-07-18 NOTE — Therapy (Signed)
OUTPATIENT PHYSICAL THERAPY SHOULDER EVALUATION   Patient Name: Bradley Lawson MRN: [id number] DOB:06-Jul-1980, 41 y.o., male Today's Date: 07/18/2021   PT End of Session - 07/18/21 0845     Visit Number 1    Number of Visits 12    Date for PT Re-Evaluation 08/29/21    Authorization Type [employer]Medicare    PT Start Time [phone]    PT Stop Time 0931    PT Time Calculation (min) 45 min             Past Medical History:  Diagnosis Date   Abnormal CT of the chest 12/13/2015   Formatting of this note might be different from the original. 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   Allergy    Anxiety    Chronic pain of both shoulders 11/23/2014   Colon polyps    Hyperlipidemia 11/12/2020   Migraine headache    Osteoarthritis    shoulder   Palpitation    cleared by cardiologist as benign   Shoulder pain    left   Past Surgical History:  Procedure Laterality Date   COLONOSCOPY     UPPER GASTROINTESTINAL ENDOSCOPY     Patient Active Problem List   Diagnosis Date Noted   Palpitations 11/16/2020   Dyspnea on exertion 11/16/2020   Shoulder pain 11/12/2020   Osteoarthritis 11/12/2020   Migraine headache 11/12/2020   Colon polyps 11/12/2020   Anxiety 11/12/2020   Allergy 11/12/2020   Hyperlipidemia 11/12/2020   Intestinal metaplasia of stomach 10/01/2020   History of esophageal ulcer 06/30/2020   Hx of adenomatous colonic polyps 06/30/2020   Loose bowel movements 06/30/2020   History of Clostridioides difficile infection 12/01/2019   Adenomatous colon polyp 12/01/2019   Abnormal CT of the chest 12/13/2015   Localized primary osteoarthritis of shoulder regions, bilateral 11/23/2014   Chronic pain of both shoulders 11/23/2014   Chronic nonintractable headache 11/17/2014    PCP: Marrian Salvage  REFERRING PROVIDER: Lynne Leader, MD  REFERRING DIAG: [phone] (ICD-10-CM) - Chronic left shoulder  pain M79.661 (ICD-10-CM) - Right calf pain  THERAPY DIAG:  Chronic left shoulder pain  Right calf pain  Muscle weakness (generalized)  Rationale for Evaluation and Treatment Rehabilitation  ONSET DATE: chronic 1.5 years in right   SUBJECTIVE:                                                                                                                                                                                      SUBJECTIVE STATEMENT: States that he tore his calf 1.5 years ago and he had a blood clot in his calf  and now it is good. States that for a full year he couldn't do much. States that when he runs he gets tightness in his calf.   States that he has had shoulder pain and he can't do shoulder presses. States when he plays tennis he has pain after serving. States that this has been a chronic thing but the last month he has been doing high rows and now it is more painful and it is no longer going away. Xray clear. States that he does a set routine at home with shoulders but the high row was new. Pain is in the front of the left shoulder.   Has had PT before for flexibility.  PERTINENT HISTORY: No significant history.   PAIN:  Are you having pain? Yes: NPRS scale: 4/10 Pain location: front and top of left shoulder Pain description: occ. sharp pain Aggravating factors: reaching behind and repetitious OH reaching Relieving factors: nothing, rest  PRECAUTIONS: None  WEIGHT BEARING RESTRICTIONS No  FALLS:  Has patient fallen in last 6 months? 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,  PLOF: Independent  PATIENT GOALS Would like to be able to play tennis for an hour without felling his calf tightness, to have no left shoulder pain   OBJECTIVE:   DIAGNOSTIC FINDINGS:  Xray of left shoulder - no significant findings- mild OA     COGNITION:  Overall cognitive status: Within functional limits for tasks  assessed     SENSATION: WFL  POSTURE: Forward head, rounded shoulders, stiff upper back, arms can't come to sides (restricted lats)    UE Measurements Upper Extremity Right 07/18/2021 Left 07/18/2021   A/PROM MMT A/PROM MMT  Shoulder Flexion 150**  160   Shoulder Extension      Shoulder Abduction 140* 5 140* 4+*  Shoulder Adduction      Shoulder Internal Rotation Reaches to right low back* 4+ Reaches to T12 SP 4+  Shoulder External Rotation Reaches to C7SP 4- Reaches to C7SP 4-  Elbow Flexion      Elbow Extension      Wrist Flexion      Wrist Extension      Wrist Supination      Wrist Pronation      Wrist Ulnar Deviation      Wrist Radial Deviation      Grip Strength NA  NA     (Blank rows = not tested)   * pain * scaption    LE Measurements Lower Extremity Right 07/18/2021 Left 07/18/2021   A/PROM MMT A/PROM MMT  Hip Flexion      Hip Extension      Hip Abduction      Hip Adduction      Hip Internal rotation      Hip External rotation      Knee Flexion      Knee Extension      Ankle Dorsiflexion      Ankle Plantarflexion  4+  5  Ankle Inversion      Ankle Eversion       (Blank rows = not tested)  * pain  JOINT MOBILITY TESTING:  Hypomobility in B shoulders, thoracic spine  PALPATION:  Tenderness to palpation along thoracic paraspinals, rhomboids, UT   TODAY'S TREATMENT:  07/18/2021 Therapeutic Exercise:  Aerobic: Supine: Prone: thread the needle x10 B  Seated:  Standing: self mobilization with tennis ball 5 minutes, shoulder ext with towel x15 5" holds, SL heel raises Neuromuscular  Re-education: Manual Therapy: Therapeutic Activity: Self Care: Trigger Point Dry Needling:  Modalities:     PATIENT EDUCATION:  Education details: on current presentation, on HEP, on clinical outcomes score and POC, on anatomy, typical progression and rehab Person educated: Patient Education method: Explanation, Demonstration, and Handouts Education comprehension:  verbalized understanding    HOME EXERCISE PROGRAM: [id number]  ASSESSMENT:  CLINICAL IMPRESSION: Patient is a 41 y.o. male who was seen today for physical therapy evaluation and treatment for left shoulder pain and right calf pain. Patient presents with hypomobilities in thoracic spine and shoulder that are currently contributing to his pain presentation. Patient is primarily sedentary during the day working at a desk which is likely contributing to his presentation. Educated patient in importance of mobility, POC and HEP. Patient would greatly benefit from skilled PT to improve overall function and QOL.    OBJECTIVE IMPAIRMENTS decreased activity tolerance, decreased mobility, decreased ROM, decreased strength, impaired flexibility, impaired UE functional use, improper body mechanics, postural dysfunction, and pain.   ACTIVITY LIMITATIONS lifting, dressing, and reach over head  PARTICIPATION LIMITATIONS:  playing tennis  PERSONAL FACTORS Age, Fitness, and Profession are also affecting patient's functional outcome.   REHAB POTENTIAL: Good  CLINICAL DECISION MAKING: Stable/uncomplicated  EVALUATION COMPLEXITY: Low   GOALS: Goals reviewed with patient?  yes  SHORT TERM GOALS:  Patient will be independent in self management strategies to improve quality of life and functional outcomes. Baseline: new program Target date: 08/08/2021 Goal status: INITIAL  2.  Patient will report at least 50% improvement in overall symptoms and/or function to demonstrate improved functional mobility Baseline: 0% Target date: 08/08/2021 Goal status: INITIAL  3.  Patient will be able to demonstrate painfree shoulder AROM Baseline: painful Target date: 08/08/2021 Goal status: INITIAL     LONG TERM GOALS:  Patient will report at least 75% improvement in overall symptoms and/or function to demonstrate improved functional mobility Baseline: 0% Target date: 08/29/2021 Goal status: INITIAL  2.   Patient will be able to participate in tennis without difficulties or pain Baseline: painful Target date: 08/29/2021 Goal status: INITIAL  3.  Patient will demonstrate at least 4/5 MMT in bilateral shoulders Baseline: unable Target date: 08/29/2021 Goal status: INITIAL    PLAN: PT FREQUENCY: 2x/week  PT DURATION: 6 weeks  PLANNED INTERVENTIONS: Therapeutic exercises, Therapeutic activity, Neuromuscular re-education, Balance training, Gait training, Patient/Family education, Joint manipulation, Joint mobilization, Aquatic Therapy, Dry Needling, Cognitive remediation, Spinal manipulation, Cryotherapy, Moist heat, Vasopneumatic device, Traction, Ultrasound, Ionotophoresis '4mg'$ /ml Dexamethasone, and Manual therapy  PLAN FOR NEXT SESSION: thoracic/scapular mobility, left shoulder traction/mobs stretchs, lat stretch, end range strengthening.  11:53 AM, 07/18/21 Jerene Pitch, DPT Physical Therapy with [facility]

## 2021-07-20 NOTE — Therapy (Signed)
OUTPATIENT PHYSICAL THERAPY TREATMENT NOTE   Patient Name: Bradley Lawson MRN: [id number] DOB:06/13/1980, 41 y.o., male Today's Date: 07/21/2021  END OF SESSION:   PT End of Session - 07/21/21 1101     Visit Number 2    Number of Visits 12    Date for PT Re-Evaluation 08/29/21    Authorization Type [employer]    PT Start Time 1103    PT Stop Time 1142    PT Time Calculation (min) 39 min             Past Medical History:  Diagnosis Date   Abnormal CT of the chest 12/13/2015   Formatting of this note might be different from the original. 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   Allergy    Anxiety    Chronic pain of both shoulders 11/23/2014   Colon polyps    Hyperlipidemia 11/12/2020   Migraine headache    Osteoarthritis    shoulder   Palpitation    cleared by cardiologist as benign   Shoulder pain    left   Past Surgical History:  Procedure Laterality Date   COLONOSCOPY     UPPER GASTROINTESTINAL ENDOSCOPY     Patient Active Problem List   Diagnosis Date Noted   Palpitations 11/16/2020   Dyspnea on exertion 11/16/2020   Shoulder pain 11/12/2020   Osteoarthritis 11/12/2020   Migraine headache 11/12/2020   Colon polyps 11/12/2020   Anxiety 11/12/2020   Allergy 11/12/2020   Hyperlipidemia 11/12/2020   Intestinal metaplasia of stomach 10/01/2020   History of esophageal ulcer 06/30/2020   Hx of adenomatous colonic polyps 06/30/2020   Loose bowel movements 06/30/2020   History of Clostridioides difficile infection 12/01/2019   Adenomatous colon polyp 12/01/2019   Abnormal CT of the chest 12/13/2015   Localized primary osteoarthritis of shoulder regions, bilateral 11/23/2014   Chronic pain of both shoulders 11/23/2014   Chronic nonintractable headache 11/17/2014     PCP: Marrian Salvage   REFERRING PROVIDER: Lynne Leader, MD   REFERRING DIAG: [phone] (ICD-10-CM) - Chronic left  shoulder pain M79.661 (ICD-10-CM) - Right calf pain   THERAPY DIAG:  Chronic left shoulder pain   Right calf pain   Muscle weakness (generalized)   Rationale for Evaluation and Treatment Rehabilitation   ONSET DATE: chronic 1.5 years in right    SUBJECTIVE:                                                                                                                                                                                       SUBJECTIVE STATEMENT: 07/21/2021 States that first  thing in the morning it really hurts and then during the day it is random. States that he tends to sleep on her side.   Eval: States that he tore his calf 1.5 years ago and he had a blood clot in his calf and now it is good. States that for a full year he couldn't do much. States that when he runs he gets tightness in his calf.    States that he has had shoulder pain and he can't do shoulder presses. States when he plays tennis he has pain after serving. States that this has been a chronic thing but the last month he has been doing high rows and now it is more painful and it is no longer going away. Xray clear. States that he does a set routine at home with shoulders but the high row was new. Pain is in the front of the left shoulder.    Has had PT before for flexibility.  PERTINENT HISTORY: No significant history.    PAIN:  Are you having pain? Yes: NPRS scale: 4/10 Pain location: front and top of left shoulder Pain description: occ. sharp pain Aggravating factors: reaching behind and repetitious OH reaching Relieving factors: nothing, rest   PRECAUTIONS: None   WEIGHT BEARING RESTRICTIONS No   FALLS:  Has patient fallen in last 6 months? 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,   PLOF: Independent   PATIENT GOALS Would like to be able to play tennis for an hour without felling his calf tightness, to have no left shoulder pain    OBJECTIVE:    DIAGNOSTIC FINDINGS:   Xray of left shoulder - no significant findings- mild OA        COGNITION:           Overall cognitive status: Within functional limits for tasks assessed                                  SENSATION: WFL   POSTURE: Forward head, rounded shoulders, stiff upper back, arms can't come to sides (restricted lats)                UE Measurements       Upper Extremity Right 07/18/2021 Left 07/18/2021    A/PROM MMT A/PROM MMT  Shoulder Flexion 150**   160    Shoulder Extension          Shoulder Abduction 140* 5 140* 4+*  Shoulder Adduction          Shoulder Internal Rotation Reaches to right low back* 4+ Reaches to T12 SP 4+  Shoulder External Rotation Reaches to C7SP 4- Reaches to C7SP 4-  Elbow Flexion          Elbow Extension          Wrist Flexion          Wrist Extension          Wrist Supination          Wrist Pronation          Wrist Ulnar Deviation          Wrist Radial Deviation          Grip Strength NA   NA                          (Blank rows =  not tested)                       * pain * scaption                LE Measurements       Lower Extremity Right 07/18/2021 Left 07/18/2021    A/PROM MMT A/PROM MMT  Hip Flexion          Hip Extension          Hip Abduction          Hip Adduction          Hip Internal rotation          Hip External rotation          Knee Flexion          Knee Extension          Ankle Dorsiflexion          Ankle Plantarflexion   4+   5  Ankle Inversion          Ankle Eversion           (Blank rows = not tested)            * pain   JOINT MOBILITY TESTING:  Hypomobility in B shoulders, thoracic spine   PALPATION:  Tenderness to palpation along thoracic paraspinals, rhomboids, UT             TODAY'S TREATMENT:  07/21/2021  Therapeutic Exercise:            Aerobic: Supine: protraction x25 5" holds B PT assist Prone: scap retraction with depression 2x10 5" holds, shoulder ER 2x10 2" hlds L, belly breathing and lower rib expansion 5  minutes tactile cues            Seated:            Standing:  Neuromuscular Re-education: Manual Therapy:, scapular mobility left in all directions, traction in left shoulder joint, AP with shoulder ER left Therapeutic Activity: Self Care: Trigger Point Dry Needling:  Modalities:        PATIENT EDUCATION:  Education details: on  HEP on current presentation. Person educated: Patient Education method: Explanation, Demonstration, and Handouts Education comprehension: verbalized understanding       HOME EXERCISE PROGRAM: [id number]   ASSESSMENT:   CLINICAL IMPRESSION: 07/21/2021 Session focused on mobilization of right shoulder and shoulder blade. Tolerated this well. Continues to compensate with posterior tilt of shoulder blade and anterior translation of humeral head to achieve greater ER of left shoulder. Tolerated well and added this to HEP  Eval: Patient is a 41 y.o. male who was seen today for physical therapy evaluation and treatment for left shoulder pain and right calf pain. Patient presents with hypomobilities in thoracic spine and shoulder that are currently contributing to his pain presentation. Patient is primarily sedentary during the day working at a desk which is likely contributing to his presentation. Educated patient in importance of mobility, POC and HEP. Patient would greatly benefit from skilled PT to improve overall function and QOL.      OBJECTIVE IMPAIRMENTS decreased activity tolerance, decreased mobility, decreased ROM, decreased strength, impaired flexibility, impaired UE functional use, improper body mechanics, postural dysfunction, and pain.    ACTIVITY LIMITATIONS lifting, dressing, and reach over head   PARTICIPATION LIMITATIONS:  playing tennis   PERSONAL FACTORS Age, Fitness, and Profession are also affecting patient's functional outcome.    REHAB POTENTIAL: Good  CLINICAL DECISION MAKING: Stable/uncomplicated   EVALUATION COMPLEXITY: Low      GOALS: Goals reviewed with patient?  yes   SHORT TERM GOALS:   Patient will be independent in self management strategies to improve quality of life and functional outcomes. Baseline: new program Target date: 08/08/2021 Goal status: INITIAL   2.  Patient will report at least 50% improvement in overall symptoms and/or function to demonstrate improved functional mobility Baseline: 0% Target date: 08/08/2021 Goal status: INITIAL   3.  Patient will be able to demonstrate painfree shoulder AROM Baseline: painful Target date: 08/08/2021 Goal status: INITIAL         LONG TERM GOALS:   Patient will report at least 75% improvement in overall symptoms and/or function to demonstrate improved functional mobility Baseline: 0% Target date: 08/29/2021 Goal status: INITIAL   2.  Patient will be able to participate in tennis without difficulties or pain Baseline: painful Target date: 08/29/2021 Goal status: INITIAL   3.  Patient will demonstrate at least 4/5 MMT in bilateral shoulders Baseline: unable Target date: 08/29/2021 Goal status: INITIAL       PLAN: PT FREQUENCY: 2x/week   PT DURATION: 6 weeks   PLANNED INTERVENTIONS: Therapeutic exercises, Therapeutic activity, Neuromuscular re-education, Balance training, Gait training, Patient/Family education, Joint manipulation, Joint mobilization, Aquatic Therapy, Dry Needling, Cognitive remediation, Spinal manipulation, Cryotherapy, Moist heat, Vasopneumatic device, Traction, Ultrasound, Ionotophoresis '4mg'$ /ml Dexamethasone, and Manual therapy   PLAN FOR NEXT SESSION: shoulder ER AP of shoulder, thoracic mobility   11:50 AM, 07/21/21 Jerene Pitch, DPT Physical Therapy with [facility] S[facility]

## 2021-07-21 ENCOUNTER — Encounter: Payer: Self-pay | Admitting: Physical Therapy

## 2021-07-21 ENCOUNTER — Ambulatory Visit: Payer: Managed Care, Other (non HMO) | Admitting: Physical Therapy

## 2021-07-21 DIAGNOSIS — M6281 Muscle weakness (generalized): Secondary | ICD-10-CM

## 2021-07-21 DIAGNOSIS — G8929 Other chronic pain: Secondary | ICD-10-CM

## 2021-07-25 ENCOUNTER — Ambulatory Visit: Payer: Managed Care, Other (non HMO) | Admitting: Physical Therapy

## 2021-07-25 ENCOUNTER — Encounter: Payer: Self-pay | Admitting: Physical Therapy

## 2021-07-25 DIAGNOSIS — G8929 Other chronic pain: Secondary | ICD-10-CM

## 2021-07-25 DIAGNOSIS — M6281 Muscle weakness (generalized): Secondary | ICD-10-CM

## 2021-07-25 NOTE — Therapy (Addendum)
OUTPATIENT PHYSICAL THERAPY TREATMENT NOTE   Patient Name: Crystal Ellwood MRN: [id number] DOB:1980-09-14, 41 y.o., male Today's Date: 07/25/2021  END OF SESSION:   PT End of Session - 07/25/21 0801     Visit Number 3    Number of Visits 12    Date for PT Re-Evaluation 08/29/21    Authorization Type [employer]    PT Start Time 0802    PT Stop Time [phone]    PT Time Calculation (min) 40 min             Past Medical History:  Diagnosis Date   Abnormal CT of the chest 12/13/2015   Formatting of this note might be different from the original. 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   Allergy    Anxiety    Chronic pain of both shoulders 11/23/2014   Colon polyps    Hyperlipidemia 11/12/2020   Migraine headache    Osteoarthritis    shoulder   Palpitation    cleared by cardiologist as benign   Shoulder pain    left   Past Surgical History:  Procedure Laterality Date   COLONOSCOPY     UPPER GASTROINTESTINAL ENDOSCOPY     Patient Active Problem List   Diagnosis Date Noted   Palpitations 11/16/2020   Dyspnea on exertion 11/16/2020   Shoulder pain 11/12/2020   Osteoarthritis 11/12/2020   Migraine headache 11/12/2020   Colon polyps 11/12/2020   Anxiety 11/12/2020   Allergy 11/12/2020   Hyperlipidemia 11/12/2020   Intestinal metaplasia of stomach 10/01/2020   History of esophageal ulcer 06/30/2020   Hx of adenomatous colonic polyps 06/30/2020   Loose bowel movements 06/30/2020   History of Clostridioides difficile infection 12/01/2019   Adenomatous colon polyp 12/01/2019   Abnormal CT of the chest 12/13/2015   Localized primary osteoarthritis of shoulder regions, bilateral 11/23/2014   Chronic pain of both shoulders 11/23/2014   Chronic nonintractable headache 11/17/2014     PCP: Marrian Salvage   REFERRING PROVIDER: Lynne Leader, MD   REFERRING DIAG: [phone] (ICD-10-CM) - Chronic left  shoulder pain M79.661 (ICD-10-CM) - Right calf pain   THERAPY DIAG:  Chronic left shoulder pain   Right calf pain   Muscle weakness (generalized)   Rationale for Evaluation and Treatment Rehabilitation   ONSET DATE: chronic 1.5 years in right    SUBJECTIVE:                                                                                                                                                                                       SUBJECTIVE STATEMENT: 07/25/2021 States that he  felt good this morning, but the ER exercise on the bed is difficult as he has a squishy bed.   Eval: States that he tore his calf 1.5 years ago and he had a blood clot in his calf and now it is good. States that for a full year he couldn't do much. States that when he runs he gets tightness in his calf.    States that he has had shoulder pain and he can't do shoulder presses. States when he plays tennis he has pain after serving. States that this has been a chronic thing but the last month he has been doing high rows and now it is more painful and it is no longer going away. Xray clear. States that he does a set routine at home with shoulders but the high row was new. Pain is in the front of the left shoulder.    Has had PT before for flexibility.  PERTINENT HISTORY: No significant history.    PAIN:  Are you having pain? no: NPRS scale: 0/10 Pain location: front and top of left shoulder Pain description: occ. sharp pain Aggravating factors: reaching behind and repetitious OH reaching Relieving factors: nothing, rest   PRECAUTIONS: None   WEIGHT BEARING RESTRICTIONS No   FALLS:  Has patient fallen in last 6 months? 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,   PLOF: Independent   PATIENT GOALS Would like to be able to play tennis for an hour without felling his calf tightness, to have no left shoulder pain    OBJECTIVE:    DIAGNOSTIC FINDINGS:  Xray of left shoulder - no  significant findings- mild OA        COGNITION:           Overall cognitive status: Within functional limits for tasks assessed                                  SENSATION: WFL   POSTURE: Forward head, rounded shoulders, stiff upper back, arms can't come to sides (restricted lats)                UE Measurements       Upper Extremity Right 07/18/2021 Left 07/18/2021    A/PROM MMT A/PROM MMT  Shoulder Flexion 150**   160    Shoulder Extension          Shoulder Abduction 140* 5 140* 4+*  Shoulder Adduction          Shoulder Internal Rotation Reaches to right low back* 4+ Reaches to T12 SP 4+  Shoulder External Rotation Reaches to C7SP 4- Reaches to C7SP 4-  Elbow Flexion          Elbow Extension          Wrist Flexion          Wrist Extension          Wrist Supination          Wrist Pronation          Wrist Ulnar Deviation          Wrist Radial Deviation          Grip Strength NA   NA                          (Blank rows = not tested)                       *  pain * scaption                LE Measurements       Lower Extremity Right 07/18/2021 Left 07/18/2021    A/PROM MMT A/PROM MMT  Hip Flexion          Hip Extension          Hip Abduction          Hip Adduction          Hip Internal rotation          Hip External rotation          Knee Flexion          Knee Extension          Ankle Dorsiflexion          Ankle Plantarflexion   4+   5  Ankle Inversion          Ankle Eversion           (Blank rows = not tested)            * pain   JOINT MOBILITY TESTING:  Hypomobility in B shoulders, thoracic spine   PALPATION:  Tenderness to palpation along thoracic paraspinals, rhomboids, UT             TODAY'S TREATMENT:  07/25/2021 Therapeutic Exercise:            Aerobic: Supine: shoulder flexion with band for ER 4x5 - long rest breaks; protraction 3x10 5" with red band,             Seated:            Standing: shoulder ER at wall x15 5" holds, wall walks not tolerated,  shoulder Ext stretch x15 5" holds Neuromuscular Re-education: Manual Therapy:, AP and traction with left shoulder focus on ER  Therapeutic Activity: Self Care: Trigger Point Dry Needling:  Modalities:        PATIENT EDUCATION:  Education details: on  HEP, Person educated: Patient Education method: Explanation, Media planner, and Handouts Education comprehension: verbalized understanding       HOME EXERCISE PROGRAM: [id number]   ASSESSMENT:   CLINICAL IMPRESSION: 07/25/2021 Continued to progress strengthening exercises as tolerated. Slight pain with wall exercises. Transitioned to stretches and this helped reduce stress on anterior side of shoulder. Slight soreness noted on front of shoulder end of session but no pain. Will continue with current POC.  Eval: Patient is a 41 y.o. male who was seen today for physical therapy evaluation and treatment for left shoulder pain and right calf pain. Patient presents with hypomobilities in thoracic spine and shoulder that are currently contributing to his pain presentation. Patient is primarily sedentary during the day working at a desk which is likely contributing to his presentation. Educated patient in importance of mobility, POC and HEP. Patient would greatly benefit from skilled PT to improve overall function and QOL.      OBJECTIVE IMPAIRMENTS decreased activity tolerance, decreased mobility, decreased ROM, decreased strength, impaired flexibility, impaired UE functional use, improper body mechanics, postural dysfunction, and pain.    ACTIVITY LIMITATIONS lifting, dressing, and reach over head   PARTICIPATION LIMITATIONS:  playing tennis   PERSONAL FACTORS Age, Fitness, and Profession are also affecting patient's functional outcome.    REHAB POTENTIAL: Good   CLINICAL DECISION MAKING: Stable/uncomplicated   EVALUATION COMPLEXITY: Low     GOALS: Goals reviewed with patient?  yes   SHORT TERM GOALS:   Patient will  be  independent in self management strategies to improve quality of life and functional outcomes. Baseline: new program Target date: 08/08/2021 Goal status: INITIAL   2.  Patient will report at least 50% improvement in overall symptoms and/or function to demonstrate improved functional mobility Baseline: 0% Target date: 08/08/2021 Goal status: INITIAL   3.  Patient will be able to demonstrate painfree shoulder AROM Baseline: painful Target date: 08/08/2021 Goal status: INITIAL         LONG TERM GOALS:   Patient will report at least 75% improvement in overall symptoms and/or function to demonstrate improved functional mobility Baseline: 0% Target date: 08/29/2021 Goal status: INITIAL   2.  Patient will be able to participate in tennis without difficulties or pain Baseline: painful Target date: 08/29/2021 Goal status: INITIAL   3.  Patient will demonstrate at least 4/5 MMT in bilateral shoulders Baseline: unable Target date: 08/29/2021 Goal status: INITIAL       PLAN: PT FREQUENCY: 2x/week   PT DURATION: 6 weeks   PLANNED INTERVENTIONS: Therapeutic exercises, Therapeutic activity, Neuromuscular re-education, Balance training, Gait training, Patient/Family education, Joint manipulation, Joint mobilization, Aquatic Therapy, Dry Needling, Cognitive remediation, Spinal manipulation, Cryotherapy, Moist heat, Vasopneumatic device, Traction, Ultrasound, Ionotophoresis '4mg'$ /ml Dexamethasone, and Manual therapy   PLAN FOR NEXT SESSION: shoulder ER AP of shoulder, thoracic mobility   9:14 AM, 07/25/21 Jerene Pitch, DPT Physical Therapy with [facility] Sinner

## 2021-08-01 ENCOUNTER — Encounter: Payer: Self-pay | Admitting: Physical Therapy

## 2021-08-01 ENCOUNTER — Ambulatory Visit (INDEPENDENT_AMBULATORY_CARE_PROVIDER_SITE_OTHER): Payer: Managed Care, Other (non HMO) | Admitting: Physical Therapy

## 2021-08-01 DIAGNOSIS — G8929 Other chronic pain: Secondary | ICD-10-CM

## 2021-08-01 DIAGNOSIS — M25512 Pain in left shoulder: Secondary | ICD-10-CM

## 2021-08-01 DIAGNOSIS — M6281 Muscle weakness (generalized): Secondary | ICD-10-CM | POA: Diagnosis not present

## 2021-08-01 NOTE — Therapy (Signed)
OUTPATIENT PHYSICAL THERAPY TREATMENT NOTE   Patient Name: Bradley Lawson MRN: [id number] DOB:09/15/1980, 41 y.o., male Today's Date: 08/01/2021  END OF SESSION:   PT End of Session - 08/01/21 0928     Visit Number 4    Number of Visits 12    Date for PT Re-Evaluation 08/29/21    Authorization Type [employer]    PT Start Time 0930    PT Stop Time 1013    PT Time Calculation (min) 43 min             Past Medical History:  Diagnosis Date   Abnormal CT of the chest 12/13/2015   Formatting of this note might be different from the original. 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   Allergy    Anxiety    Chronic pain of both shoulders 11/23/2014   Colon polyps    Hyperlipidemia 11/12/2020   Migraine headache    Osteoarthritis    shoulder   Palpitation    cleared by cardiologist as benign   Shoulder pain    left   Past Surgical History:  Procedure Laterality Date   COLONOSCOPY     UPPER GASTROINTESTINAL ENDOSCOPY     Patient Active Problem List   Diagnosis Date Noted   Palpitations 11/16/2020   Dyspnea on exertion 11/16/2020   Shoulder pain 11/12/2020   Osteoarthritis 11/12/2020   Migraine headache 11/12/2020   Colon polyps 11/12/2020   Anxiety 11/12/2020   Allergy 11/12/2020   Hyperlipidemia 11/12/2020   Intestinal metaplasia of stomach 10/01/2020   History of esophageal ulcer 06/30/2020   Hx of adenomatous colonic polyps 06/30/2020   Loose bowel movements 06/30/2020   History of Clostridioides difficile infection 12/01/2019   Adenomatous colon polyp 12/01/2019   Abnormal CT of the chest 12/13/2015   Localized primary osteoarthritis of shoulder regions, bilateral 11/23/2014   Chronic pain of both shoulders 11/23/2014   Chronic nonintractable headache 11/17/2014     PCP: Marrian Salvage   REFERRING PROVIDER: Lynne Leader, MD   REFERRING DIAG: [phone] (ICD-10-CM) - Chronic left  shoulder pain M79.661 (ICD-10-CM) - Right calf pain   THERAPY DIAG:  Chronic left shoulder pain   Right calf pain   Muscle weakness (generalized)   Rationale for Evaluation and Treatment Rehabilitation   ONSET DATE: chronic 1.5 years in right    SUBJECTIVE:                                                                                                                                                                                       SUBJECTIVE STATEMENT: 08/01/2021 States that his  shoulder has been hurting. Reports good and bad days. States that it is not significantly better overall. States that he couldn't workout last week as it was so painful.  Eval: States that he tore his calf 1.5 years ago and he had a blood clot in his calf and now it is good. States that for a full year he couldn't do much. States that when he runs he gets tightness in his calf.    States that he has had shoulder pain and he can't do shoulder presses. States when he plays tennis he has pain after serving. States that this has been a chronic thing but the last month he has been doing high rows and now it is more painful and it is no longer going away. Xray clear. States that he does a set routine at home with shoulders but the high row was new. Pain is in the front of the left shoulder.    Has had PT before for flexibility.  PERTINENT HISTORY: No significant history.    PAIN:  Are you having pain? no: NPRS scale: 2/10 Pain location: front and top of left shoulder Pain description: occ. sharp pain Aggravating factors: reaching behind and repetitious OH reaching Relieving factors: nothing, rest   PRECAUTIONS: None   WEIGHT BEARING RESTRICTIONS No   FALLS:  Has patient fallen in last 6 months? 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,   PLOF: Independent   PATIENT GOALS Would like to be able to play tennis for an hour without felling his calf tightness, to have no left shoulder  pain    OBJECTIVE:    DIAGNOSTIC FINDINGS:  Xray of left shoulder - no significant findings- mild OA        COGNITION:           Overall cognitive status: Within functional limits for tasks assessed                                  SENSATION: WFL   POSTURE: Forward head, rounded shoulders, stiff upper back, arms can't come to sides (restricted lats)                UE Measurements       Upper Extremity Right 07/18/2021 Left 07/18/2021    A/PROM MMT A/PROM MMT  Shoulder Flexion 150**   160    Shoulder Extension          Shoulder Abduction 140* 5 140* 4+*  Shoulder Adduction          Shoulder Internal Rotation Reaches to right low back* 4+ Reaches to T12 SP 4+  Shoulder External Rotation Reaches to C7SP 4- Reaches to C7SP 4-  Elbow Flexion          Elbow Extension          Wrist Flexion          Wrist Extension          Wrist Supination          Wrist Pronation          Wrist Ulnar Deviation          Wrist Radial Deviation          Grip Strength NA   NA                          (  Blank rows = not tested)                       * pain * scaption                LE Measurements       Lower Extremity Right 07/18/2021 Left 07/18/2021    A/PROM MMT A/PROM MMT  Hip Flexion          Hip Extension          Hip Abduction          Hip Adduction          Hip Internal rotation          Hip External rotation          Knee Flexion          Knee Extension          Ankle Dorsiflexion          Ankle Plantarflexion   4+   5  Ankle Inversion          Ankle Eversion           (Blank rows = not tested)            * pain   JOINT MOBILITY TESTING:  Hypomobility in B shoulders, thoracic spine   PALPATION:  Tenderness to palpation along thoracic paraspinals, rhomboids, UT             TODAY'S TREATMENT:  08/01/2021 Therapeutic Exercise:            seated: self massage with percussion gun 6 minutes to hands, supination stretch 3# weight x15 B 10" holds  Supine:             quad:  finger abd x10 10" holds B, wrist extension rocking 5 minutes total, then with vibration 5 minutes            Standing:  Neuromuscular Re-education: Manual Therapy:, Therapeutic Activity: Self Care: Trigger Point Dry Needling:  Modalities:        PATIENT EDUCATION:  Education details: on  HEP, on importance of mobility. On how tomove and how to use vibration, to get video of rowss Person educated: Patient Education method: Explanation, Demonstration, and Handouts Education comprehension: verbalized understanding       HOME EXERCISE PROGRAM: [email or web address]   ASSESSMENT:   CLINICAL IMPRESSION: 08/01/2021 Session focused on mobility and importance of wrist and finger motion to reduce overuse of shoulders. Educated patient on form, vibration and easing into stretches. Fatigue in wrist extensors noted with wrist exercises. Instructed patient to obtain video of row as at home ot ensure good form. Will also review protraction next session.  Eval: Patient is a 41 y.o. male who was seen today for physical therapy evaluation and treatment for left shoulder pain and right calf pain. Patient presents with hypomobilities in thoracic spine and shoulder that are currently contributing to his pain presentation. Patient is primarily sedentary during the day working at a desk which is likely contributing to his presentation. Educated patient in importance of mobility, POC and HEP. Patient would greatly benefit from skilled PT to improve overall function and QOL.      OBJECTIVE IMPAIRMENTS decreased activity tolerance, decreased mobility, decreased ROM, decreased strength, impaired flexibility, impaired UE functional use, improper body mechanics, postural dysfunction, and pain.    ACTIVITY LIMITATIONS lifting, dressing, and reach over head   PARTICIPATION LIMITATIONS:  playing tennis   PERSONAL FACTORS  Age, Fitness, and Profession are also affecting patient's functional outcome.    REHAB POTENTIAL:  Good   CLINICAL DECISION MAKING: Stable/uncomplicated   EVALUATION COMPLEXITY: Low     GOALS: Goals reviewed with patient?  yes   SHORT TERM GOALS:   Patient will be independent in self management strategies to improve quality of life and functional outcomes. Baseline: new program Target date: 08/08/2021 Goal status: INITIAL   2.  Patient will report at least 50% improvement in overall symptoms and/or function to demonstrate improved functional mobility Baseline: 0% Target date: 08/08/2021 Goal status: INITIAL   3.  Patient will be able to demonstrate painfree shoulder AROM Baseline: painful Target date: 08/08/2021 Goal status: INITIAL         LONG TERM GOALS:   Patient will report at least 75% improvement in overall symptoms and/or function to demonstrate improved functional mobility Baseline: 0% Target date: 08/29/2021 Goal status: INITIAL   2.  Patient will be able to participate in tennis without difficulties or pain Baseline: painful Target date: 08/29/2021 Goal status: INITIAL   3.  Patient will demonstrate at least 4/5 MMT in bilateral shoulders Baseline: unable Target date: 08/29/2021 Goal status: INITIAL       PLAN: PT FREQUENCY: 2x/week   PT DURATION: 6 weeks   PLANNED INTERVENTIONS: Therapeutic exercises, Therapeutic activity, Neuromuscular re-education, Balance training, Gait training, Patient/Family education, Joint manipulation, Joint mobilization, Aquatic Therapy, Dry Needling, Cognitive remediation, Spinal manipulation, Cryotherapy, Moist heat, Vasopneumatic device, Traction, Ultrasound, Ionotophoresis '4mg'$ /ml Dexamethasone, and Manual therapy   PLAN FOR NEXT SESSION: row form with band and bent over - video for review, shoulder ER AP of shoulder, thoracic mobility   10:18 AM, 08/01/21 Jerene Pitch, DPT Physical Therapy with [facility]

## 2021-08-04 ENCOUNTER — Ambulatory Visit (INDEPENDENT_AMBULATORY_CARE_PROVIDER_SITE_OTHER): Payer: Managed Care, Other (non HMO) | Admitting: Physical Therapy

## 2021-08-04 ENCOUNTER — Encounter: Payer: Self-pay | Admitting: Physical Therapy

## 2021-08-04 DIAGNOSIS — G8929 Other chronic pain: Secondary | ICD-10-CM

## 2021-08-04 DIAGNOSIS — M25512 Pain in left shoulder: Secondary | ICD-10-CM | POA: Diagnosis not present

## 2021-08-04 DIAGNOSIS — M6281 Muscle weakness (generalized): Secondary | ICD-10-CM | POA: Diagnosis not present

## 2021-08-04 NOTE — Therapy (Signed)
OUTPATIENT PHYSICAL THERAPY TREATMENT NOTE   Patient Name: Laterrian Hevener MRN: [id number] DOB:October 31, 1980, 41 y.o., male Today's Date: 08/04/2021  END OF SESSION:   PT End of Session - 08/04/21 1008     Visit Number 5    Number of Visits 12    Date for PT Re-Evaluation 08/29/21    Authorization Type [employer]    PT Start Time 1015    PT Stop Time 1055    PT Time Calculation (min) 40 min             Past Medical History:  Diagnosis Date   Abnormal CT of the chest 12/13/2015   Formatting of this note might be different from the original. 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   Allergy    Anxiety    Chronic pain of both shoulders 11/23/2014   Colon polyps    Hyperlipidemia 11/12/2020   Migraine headache    Osteoarthritis    shoulder   Palpitation    cleared by cardiologist as benign   Shoulder pain    left   Past Surgical History:  Procedure Laterality Date   COLONOSCOPY     UPPER GASTROINTESTINAL ENDOSCOPY     Patient Active Problem List   Diagnosis Date Noted   Palpitations 11/16/2020   Dyspnea on exertion 11/16/2020   Shoulder pain 11/12/2020   Osteoarthritis 11/12/2020   Migraine headache 11/12/2020   Colon polyps 11/12/2020   Anxiety 11/12/2020   Allergy 11/12/2020   Hyperlipidemia 11/12/2020   Intestinal metaplasia of stomach 10/01/2020   History of esophageal ulcer 06/30/2020   Hx of adenomatous colonic polyps 06/30/2020   Loose bowel movements 06/30/2020   History of Clostridioides difficile infection 12/01/2019   Adenomatous colon polyp 12/01/2019   Abnormal CT of the chest 12/13/2015   Localized primary osteoarthritis of shoulder regions, bilateral 11/23/2014   Chronic pain of both shoulders 11/23/2014   Chronic nonintractable headache 11/17/2014     PCP: Marrian Salvage   REFERRING PROVIDER: Lynne Leader, MD   REFERRING DIAG: [phone] (ICD-10-CM) - Chronic left  shoulder pain M79.661 (ICD-10-CM) - Right calf pain   THERAPY DIAG:  Chronic left shoulder pain   Right calf pain   Muscle weakness (generalized)   Rationale for Evaluation and Treatment Rehabilitation   ONSET DATE: chronic 1.5 years in right    SUBJECTIVE:                                                                                                                                                                                       SUBJECTIVE STATEMENT: 08/04/2021 Better but still  having pain in the morning. States that he has pain initially with working out but then it gets better.  Eval: States that he tore his calf 1.5 years ago and he had a blood clot in his calf and now it is good. States that for a full year he couldn't do much. States that when he runs he gets tightness in his calf.    States that he has had shoulder pain and he can't do shoulder presses. States when he plays tennis he has pain after serving. States that this has been a chronic thing but the last month he has been doing high rows and now it is more painful and it is no longer going away. Xray clear. States that he does a set routine at home with shoulders but the high row was new. Pain is in the front of the left shoulder.    Has had PT before for flexibility.  PERTINENT HISTORY: No significant history.    PAIN:  Are you having pain? no: NPRS scale: 0/10 Pain location: front and top of left shoulder Pain description: occ. sharp pain Aggravating factors: reaching behind and repetitious OH reaching Relieving factors: nothing, rest   PRECAUTIONS: None   WEIGHT BEARING RESTRICTIONS No   FALLS:  Has patient fallen in last 6 months? 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,   PLOF: Independent   PATIENT GOALS Would like to be able to play tennis for an hour without felling his calf tightness, to have no left shoulder pain    OBJECTIVE:    DIAGNOSTIC FINDINGS:  Xray of left  shoulder - no significant findings- mild OA        COGNITION:           Overall cognitive status: Within functional limits for tasks assessed                                  SENSATION: WFL   POSTURE: Forward head, rounded shoulders, stiff upper back, arms can't come to sides (restricted lats)                UE Measurements       Upper Extremity Right 07/18/2021 Left 07/18/2021    A/PROM MMT A/PROM MMT  Shoulder Flexion 150**   160    Shoulder Extension          Shoulder Abduction 140* 5 140* 4+*  Shoulder Adduction          Shoulder Internal Rotation Reaches to right low back* 4+ Reaches to T12 SP 4+  Shoulder External Rotation Reaches to C7SP 4- Reaches to C7SP 4-  Elbow Flexion          Elbow Extension          Wrist Flexion          Wrist Extension          Wrist Supination          Wrist Pronation          Wrist Ulnar Deviation          Wrist Radial Deviation          Grip Strength NA   NA                          (Blank rows = not tested)                       *  pain * scaption                LE Measurements       Lower Extremity Right 07/18/2021 Left 07/18/2021    A/PROM MMT A/PROM MMT  Hip Flexion          Hip Extension          Hip Abduction          Hip Adduction          Hip Internal rotation          Hip External rotation          Knee Flexion          Knee Extension          Ankle Dorsiflexion          Ankle Plantarflexion   4+   5  Ankle Inversion          Ankle Eversion           (Blank rows = not tested)            * pain   JOINT MOBILITY TESTING:  Hypomobility in B shoulders, thoracic spine   PALPATION:  Tenderness to palpation along thoracic paraspinals, rhomboids, UT             TODAY'S TREATMENT:  08/04/2021 Therapeutic Exercise:            seated:  Neuromuscular Re-education: video review of row form, seated/bent over and prone row - bands and no resistance - verbal and tactile cues - prior demonstration 25 minutes Manual Therapy:,  TPR and STM to B QL and lumbar/thoracic paraspinals - tolerated well - 15 minutes Therapeutic Activity: Self Care: Trigger Point Dry Needling:  Modalities:        PATIENT EDUCATION:  Education details: on  HEP, on importance of mobility/ soft tissue work Person educated: Patient Education method: Consulting civil engineer, Media planner, and Handouts Education comprehension: verbalized understanding       HOME EXERCISE PROGRAM: [id number]   ASSESSMENT:   CLINICAL IMPRESSION: 08/04/2021 Session focused on ROW form and used prior demo, tactile cues and reviewed video of rows being performed at home. Answered all questions and improved ability to perform row after manual work. Will folllow up with stretches and manual work next session  Eval: Patient is a 41 y.o. male who was seen today for physical therapy evaluation and treatment for left shoulder pain and right calf pain. Patient presents with hypomobilities in thoracic spine and shoulder that are currently contributing to his pain presentation. Patient is primarily sedentary during the day working at a desk which is likely contributing to his presentation. Educated patient in importance of mobility, POC and HEP. Patient would greatly benefit from skilled PT to improve overall function and QOL.      OBJECTIVE IMPAIRMENTS decreased activity tolerance, decreased mobility, decreased ROM, decreased strength, impaired flexibility, impaired UE functional use, improper body mechanics, postural dysfunction, and pain.    ACTIVITY LIMITATIONS lifting, dressing, and reach over head   PARTICIPATION LIMITATIONS:  playing tennis   PERSONAL FACTORS Age, Fitness, and Profession are also affecting patient's functional outcome.    REHAB POTENTIAL: Good   CLINICAL DECISION MAKING: Stable/uncomplicated   EVALUATION COMPLEXITY: Low     GOALS: Goals reviewed with patient?  yes   SHORT TERM GOALS:   Patient will be independent in self management strategies  to improve quality of life and functional outcomes. Baseline: new program Target date: 08/08/2021 Goal status:  INITIAL   2.  Patient will report at least 50% improvement in overall symptoms and/or function to demonstrate improved functional mobility Baseline: 0% Target date: 08/08/2021 Goal status: INITIAL   3.  Patient will be able to demonstrate painfree shoulder AROM Baseline: painful Target date: 08/08/2021 Goal status: INITIAL         LONG TERM GOALS:   Patient will report at least 75% improvement in overall symptoms and/or function to demonstrate improved functional mobility Baseline: 0% Target date: 08/29/2021 Goal status: INITIAL   2.  Patient will be able to participate in tennis without difficulties or pain Baseline: painful Target date: 08/29/2021 Goal status: INITIAL   3.  Patient will demonstrate at least 4/5 MMT in bilateral shoulders Baseline: unable Target date: 08/29/2021 Goal status: INITIAL       PLAN: PT FREQUENCY: 2x/week   PT DURATION: 6 weeks   PLANNED INTERVENTIONS: Therapeutic exercises, Therapeutic activity, Neuromuscular re-education, Balance training, Gait training, Patient/Family education, Joint manipulation, Joint mobilization, Aquatic Therapy, Dry Needling, Cognitive remediation, Spinal manipulation, Cryotherapy, Moist heat, Vasopneumatic device, Traction, Ultrasound, Ionotophoresis '4mg'$ /ml Dexamethasone, and Manual therapy   PLAN FOR NEXT SESSION: lat stretch/pec stretch, row form with band and bent over - video for review, shoulder ER AP of shoulder, thoracic mobility   11:09 AM, 08/04/21 Jerene Pitch, DPT Physical Therapy with [facility]

## 2021-08-16 ENCOUNTER — Other Ambulatory Visit: Payer: Self-pay | Admitting: Neurology

## 2021-08-18 ENCOUNTER — Ambulatory Visit (INDEPENDENT_AMBULATORY_CARE_PROVIDER_SITE_OTHER): Payer: Managed Care, Other (non HMO) | Admitting: Physical Therapy

## 2021-08-18 ENCOUNTER — Encounter: Payer: Self-pay | Admitting: Physical Therapy

## 2021-08-18 DIAGNOSIS — G8929 Other chronic pain: Secondary | ICD-10-CM

## 2021-08-18 DIAGNOSIS — M6281 Muscle weakness (generalized): Secondary | ICD-10-CM | POA: Diagnosis not present

## 2021-08-18 DIAGNOSIS — M25512 Pain in left shoulder: Secondary | ICD-10-CM | POA: Diagnosis not present

## 2021-08-18 NOTE — Therapy (Signed)
OUTPATIENT PHYSICAL THERAPY TREATMENT NOTE   Patient Name: Bradley Lawson MRN: [id number] DOB:[birth year], 41 y.o., male Today's Date: 08/18/2021  END OF SESSION:   PT End of Session - 08/18/21 0758     Visit Number 6    Number of Visits 12    Date for PT Re-Evaluation 08/29/21    Authorization Type [employer]    PT Start Time 0802    PT Stop Time [phone]    PT Time Calculation (min) 40 min             Past Medical History:  Diagnosis Date   Abnormal CT of the chest 12/13/2015   Formatting of this note might be different from the original. 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   Allergy    Anxiety    Chronic pain of both shoulders 11/23/2014   Colon polyps    Hyperlipidemia 11/12/2020   Migraine headache    Osteoarthritis    shoulder   Palpitation    cleared by cardiologist as benign   Shoulder pain    left   Past Surgical History:  Procedure Laterality Date   COLONOSCOPY     UPPER GASTROINTESTINAL ENDOSCOPY     Patient Active Problem List   Diagnosis Date Noted   Palpitations 11/16/2020   Dyspnea on exertion 11/16/2020   Shoulder pain 11/12/2020   Osteoarthritis 11/12/2020   Migraine headache 11/12/2020   Colon polyps 11/12/2020   Anxiety 11/12/2020   Allergy 11/12/2020   Hyperlipidemia 11/12/2020   Intestinal metaplasia of stomach 10/01/2020   History of esophageal ulcer 06/30/2020   Hx of adenomatous colonic polyps 06/30/2020   Loose bowel movements 06/30/2020   History of Clostridioides difficile infection 12/01/2019   Adenomatous colon polyp 12/01/2019   Abnormal CT of the chest 12/13/2015   Localized primary osteoarthritis of shoulder regions, bilateral 11/23/2014   Chronic pain of both shoulders 11/23/2014   Chronic nonintractable headache 11/17/2014     PCP: Marrian Salvage   REFERRING PROVIDER: Lynne Leader, MD   REFERRING DIAG: [phone] (ICD-10-CM) - Chronic left  shoulder pain M79.661 (ICD-10-CM) - Right calf pain   THERAPY DIAG:  Chronic left shoulder pain   Right calf pain   Muscle weakness (generalized)   Rationale for Evaluation and Treatment Rehabilitation   ONSET DATE: chronic 1.5 years in right    SUBJECTIVE:                                                                                                                                                                                       SUBJECTIVE STATEMENT: 08/18/2021 States he has  been having shoulder pain and he has not done a lot of stretches as he has been on vacation. States that he has been playing with   Eval: States that he tore his calf 1.5 years ago and he had a blood clot in his calf and now it is good. States that for a full year he couldn't do much. States that when he runs he gets tightness in his calf.    States that he has had shoulder pain and he can't do shoulder presses. States when he plays tennis he has pain after serving. States that this has been a chronic thing but the last month he has been doing high rows and now it is more painful and it is no longer going away. Xray clear. States that he does a set routine at home with shoulders but the high row was new. Pain is in the front of the left shoulder.    Has had PT before for flexibility.  PERTINENT HISTORY: No significant history.    PAIN:  Are you having pain? no: NPRS scale: 0/10 Pain location: front and top of left shoulder Pain description: occ. sharp pain Aggravating factors: reaching behind and repetitious OH reaching Relieving factors: nothing, rest   PRECAUTIONS: None   WEIGHT BEARING RESTRICTIONS No   FALLS:  Has patient fallen in last 6 months? 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,   PLOF: Independent   PATIENT GOALS Would like to be able to play tennis for an hour without felling his calf tightness, to have no left shoulder pain    OBJECTIVE:    DIAGNOSTIC  FINDINGS:  Xray of left shoulder - no significant findings- mild OA        COGNITION:           Overall cognitive status: Within functional limits for tasks assessed                                  SENSATION: WFL   POSTURE: Forward head, rounded shoulders, stiff upper back, arms can't come to sides (restricted lats)                UE Measurements       Upper Extremity Right 07/18/2021 Left 07/18/2021    A/PROM MMT A/PROM MMT  Shoulder Flexion 150**   160    Shoulder Extension          Shoulder Abduction 140* 5 140* 4+*  Shoulder Adduction          Shoulder Internal Rotation Reaches to right low back* 4+ Reaches to T12 SP 4+  Shoulder External Rotation Reaches to C7SP 4- Reaches to C7SP 4-  Elbow Flexion          Elbow Extension          Wrist Flexion          Wrist Extension          Wrist Supination          Wrist Pronation          Wrist Ulnar Deviation          Wrist Radial Deviation          Grip Strength NA   NA                          (  Blank rows = not tested)                       * pain * scaption                LE Measurements       Lower Extremity Right 07/18/2021 Left 07/18/2021    A/PROM MMT A/PROM MMT  Hip Flexion          Hip Extension          Hip Abduction          Hip Adduction          Hip Internal rotation          Hip External rotation          Knee Flexion          Knee Extension          Ankle Dorsiflexion          Ankle Plantarflexion   4+   5  Ankle Inversion          Ankle Eversion           (Blank rows = not tested)            * pain   JOINT MOBILITY TESTING:  Hypomobility in B shoulders, thoracic spine   PALPATION:  Tenderness to palpation along thoracic paraspinals, rhomboids, UT             TODAY'S TREATMENT:  08/18/2021 Therapeutic Exercise:          changed home program to program below Knee to chest back stretch x5 each leg 10 second holds. = 2 minutes Lower trunk twist while laying on your back x5 on each side 5" holds  =1 minutes   Shoulder flexion Overhead reach with band while laying down- x15  On your back , black band chest fly do not go into deep ranges of motion - just 1/3 of the way with a few second hold - Plank rocking forward and backwards on toes with hands on dumbbells - sets of 5  Triceps kickbacks if not painful Standing facing wall -with forearms in pillowcase - try to rip apart the pillow case and then slide pillowcase up wall and then back down while keeping head forward - Try to set timers   Previous interventions:  Neuromuscular Re-education: video review of row form, seated/bent over and prone row - bands and no resistance - verbal and tactile cues - prior demonstration 25 minutes Manual Therapy:, TPR and STM to B QL and lumbar/thoracic paraspinals - tolerated well - 15 minutes Therapeutic Activity: Self Care: Trigger Point Dry Needling:  Modalities:        PATIENT EDUCATION:  Education details: on  HEP, on importance of mobility/ soft tissue work Person educated: Patient Education method: Consulting civil engineer, Media planner, and Handouts Education comprehension: verbalized understanding       HOME EXERCISE PROGRAM: [email or web address]   ASSESSMENT:   CLINICAL IMPRESSION: 08/18/2021 Session focused on review of current HEP and POC moving forward. Cued patient for form and answered all questions. Patient frustrated with continued pain, discussed resting from current workout routine. Patient in better spirits end of session as muscle burn in desired muscles was felt but no pain. Will continue with current POC as tolerated.   Eval: Patient is a 41 y.o. male who was seen today for physical therapy evaluation and treatment for left shoulder pain and right calf pain. Patient presents with  hypomobilities in thoracic spine and shoulder that are currently contributing to his pain presentation. Patient is primarily sedentary during the day working at a desk which is likely contributing to his presentation.  Educated patient in importance of mobility, POC and HEP. Patient would greatly benefit from skilled PT to improve overall function and QOL.      OBJECTIVE IMPAIRMENTS decreased activity tolerance, decreased mobility, decreased ROM, decreased strength, impaired flexibility, impaired UE functional use, improper body mechanics, postural dysfunction, and pain.    ACTIVITY LIMITATIONS lifting, dressing, and reach over head   PARTICIPATION LIMITATIONS:  playing tennis   PERSONAL FACTORS Age, Fitness, and Profession are also affecting patient's functional outcome.    REHAB POTENTIAL: Good   CLINICAL DECISION MAKING: Stable/uncomplicated   EVALUATION COMPLEXITY: Low     GOALS: Goals reviewed with patient?  yes   SHORT TERM GOALS:   Patient will be independent in self management strategies to improve quality of life and functional outcomes. Baseline: new program Target date: 08/08/2021 Goal status: INITIAL   2.  Patient will report at least 50% improvement in overall symptoms and/or function to demonstrate improved functional mobility Baseline: 0% Target date: 08/08/2021 Goal status: INITIAL   3.  Patient will be able to demonstrate painfree shoulder AROM Baseline: painful Target date: 08/08/2021 Goal status: INITIAL         LONG TERM GOALS:   Patient will report at least 75% improvement in overall symptoms and/or function to demonstrate improved functional mobility Baseline: 0% Target date: 08/29/2021 Goal status: INITIAL   2.  Patient will be able to participate in tennis without difficulties or pain Baseline: painful Target date: 08/29/2021 Goal status: INITIAL   3.  Patient will demonstrate at least 4/5 MMT in bilateral shoulders Baseline: unable Target date: 08/29/2021 Goal status: INITIAL       PLAN: PT FREQUENCY: 2x/week   PT DURATION: 6 weeks   PLANNED INTERVENTIONS: Therapeutic exercises, Therapeutic activity, Neuromuscular re-education, Balance training,  Gait training, Patient/Family education, Joint manipulation, Joint mobilization, Aquatic Therapy, Dry Needling, Cognitive remediation, Spinal manipulation, Cryotherapy, Moist heat, Vasopneumatic device, Traction, Ultrasound, Ionotophoresis '4mg'$ /ml Dexamethasone, and Manual therapy   PLAN FOR NEXT SESSION: lat stretch/pec stretch, row form with band and bent over - video for review, shoulder ER AP of shoulder, thoracic mobility   8:44 AM, 08/18/21 Jerene Pitch, DPT Physical Therapy with [facility]

## 2021-08-22 ENCOUNTER — Ambulatory Visit: Payer: Managed Care, Other (non HMO) | Admitting: Physical Therapy

## 2021-08-22 ENCOUNTER — Encounter: Payer: Self-pay | Admitting: Physical Therapy

## 2021-08-22 DIAGNOSIS — M25512 Pain in left shoulder: Secondary | ICD-10-CM | POA: Diagnosis not present

## 2021-08-22 DIAGNOSIS — M6281 Muscle weakness (generalized): Secondary | ICD-10-CM

## 2021-08-22 DIAGNOSIS — G8929 Other chronic pain: Secondary | ICD-10-CM | POA: Diagnosis not present

## 2021-08-22 NOTE — Therapy (Signed)
OUTPATIENT PHYSICAL THERAPY TREATMENT NOTE   Patient Name: Bradley Lawson MRN: [id number] DOB:1980-08-30, 41 y.o., male Today's Date: 08/22/2021  END OF SESSION:   PT End of Session - 08/22/21 0759     Visit Number 7    Number of Visits 12    Date for PT Re-Evaluation 08/29/21    Authorization Type [employer]    PT Start Time 0803    PT Stop Time [phone]    PT Time Calculation (min) 39 min             Past Medical History:  Diagnosis Date   Abnormal CT of the chest 12/13/2015   Formatting of this note might be different from the original. 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   Allergy    Anxiety    Chronic pain of both shoulders 11/23/2014   Colon polyps    Hyperlipidemia 11/12/2020   Migraine headache    Osteoarthritis    shoulder   Palpitation    cleared by cardiologist as benign   Shoulder pain    left   Past Surgical History:  Procedure Laterality Date   COLONOSCOPY     UPPER GASTROINTESTINAL ENDOSCOPY     Patient Active Problem List   Diagnosis Date Noted   Palpitations 11/16/2020   Dyspnea on exertion 11/16/2020   Shoulder pain 11/12/2020   Osteoarthritis 11/12/2020   Migraine headache 11/12/2020   Colon polyps 11/12/2020   Anxiety 11/12/2020   Allergy 11/12/2020   Hyperlipidemia 11/12/2020   Intestinal metaplasia of stomach 10/01/2020   History of esophageal ulcer 06/30/2020   Hx of adenomatous colonic polyps 06/30/2020   Loose bowel movements 06/30/2020   History of Clostridioides difficile infection 12/01/2019   Adenomatous colon polyp 12/01/2019   Abnormal CT of the chest 12/13/2015   Localized primary osteoarthritis of shoulder regions, bilateral 11/23/2014   Chronic pain of both shoulders 11/23/2014   Chronic nonintractable headache 11/17/2014     PCP: Marrian Salvage   REFERRING PROVIDER: Lynne Leader, MD   REFERRING DIAG: [phone] (ICD-10-CM) - Chronic left  shoulder pain M79.661 (ICD-10-CM) - Right calf pain   THERAPY DIAG:  Chronic left shoulder pain   Right calf pain   Muscle weakness (generalized)   Rationale for Evaluation and Treatment Rehabilitation   ONSET DATE: chronic 1.5 years in right    SUBJECTIVE:                                                                                                                                                                                       SUBJECTIVE STATEMENT: 08/22/2021 States that he  still has pain with laying down position and he has to shrug his shoulders to alleviate the his pain  Eval: States that he tore his calf 1.5 years ago and he had a blood clot in his calf and now it is good. States that for a full year he couldn't do much. States that when he runs he gets tightness in his calf.    States that he has had shoulder pain and he can't do shoulder presses. States when he plays tennis he has pain after serving. States that this has been a chronic thing but the last month he has been doing high rows and now it is more painful and it is no longer going away. Xray clear. States that he does a set routine at home with shoulders but the high row was new. Pain is in the front of the left shoulder.    Has had PT before for flexibility.  PERTINENT HISTORY: No significant history.    PAIN:  Are you having pain? no: NPRS scale: 0-1/10 Pain location: front and top of left shoulder Pain description: occ. sharp pain Aggravating factors: reaching behind and repetitious OH reaching Relieving factors: nothing, rest   PRECAUTIONS: None   WEIGHT BEARING RESTRICTIONS No   FALLS:  Has patient fallen in last 6 months? 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,   PLOF: Independent   PATIENT GOALS Would like to be able to play tennis for an hour without felling his calf tightness, to have no left shoulder pain    OBJECTIVE:    DIAGNOSTIC FINDINGS:  Xray of left shoulder  - no significant findings- mild OA        COGNITION:           Overall cognitive status: Within functional limits for tasks assessed                                  SENSATION: WFL   POSTURE: Forward head, rounded shoulders, stiff upper back, arms can't come to sides (restricted lats)                UE Measurements       Upper Extremity Right 07/18/2021 Left 07/18/2021    A/PROM MMT A/PROM MMT  Shoulder Flexion 150**   160    Shoulder Extension          Shoulder Abduction 140* 5 140* 4+*  Shoulder Adduction          Shoulder Internal Rotation Reaches to right low back* 4+ Reaches to T12 SP 4+  Shoulder External Rotation Reaches to C7SP 4- Reaches to C7SP 4-  Elbow Flexion          Elbow Extension          Wrist Flexion          Wrist Extension          Wrist Supination          Wrist Pronation          Wrist Ulnar Deviation          Wrist Radial Deviation          Grip Strength NA   NA                          (Blank rows = not tested)                       *  pain * scaption                LE Measurements       Lower Extremity Right 07/18/2021 Left 07/18/2021    A/PROM MMT A/PROM MMT  Hip Flexion          Hip Extension          Hip Abduction          Hip Adduction          Hip Internal rotation          Hip External rotation          Knee Flexion          Knee Extension          Ankle Dorsiflexion          Ankle Plantarflexion   4+   5  Ankle Inversion          Ankle Eversion           (Blank rows = not tested)            * pain   JOINT MOBILITY TESTING:  Hypomobility in B shoulders, thoracic spine   PALPATION:  Tenderness to palpation along thoracic paraspinals, rhomboids, UT             TODAY'S TREATMENT:  08/22/2021 Therapeutic Exercise:   - supine pec stretch 6 minutes - on hard surface, shoulder flexion with black band ER x30 B Standing pec stretch wall 5 minutes,   Previous interventions:  Neuromuscular Re-education: rolling to side with band to  reduce ant rot of left shoulder - 5 minutes, onsettign shoulder with motion standing 5 minutes Therapeutic Activity: Self Care: Trigger Point Dry Needling:  Modalities:        PATIENT EDUCATION:  Education details:on stretching prior to laying down, education on anatomy, freq and what to do when in pain and prior to working out Person educated: Patient Education method: Consulting civil engineer, Media planner, and Handouts Education comprehension: verbalized understanding       HOME EXERCISE PROGRAM: [email or web address]   ASSESSMENT:   CLINICAL IMPRESSION: 08/22/2021 Session focused on education and focus on shoulder position with motions and exercises. Tolerated this well. Patient with restrictions that limit overall shoulder motion but with focus this improved. Will continue with current POC as tolerated   Eval: Patient is a 41 y.o. male who was seen today for physical therapy evaluation and treatment for left shoulder pain and right calf pain. Patient presents with hypomobilities in thoracic spine and shoulder that are currently contributing to his pain presentation. Patient is primarily sedentary during the day working at a desk which is likely contributing to his presentation. Educated patient in importance of mobility, POC and HEP. Patient would greatly benefit from skilled PT to improve overall function and QOL.      OBJECTIVE IMPAIRMENTS decreased activity tolerance, decreased mobility, decreased ROM, decreased strength, impaired flexibility, impaired UE functional use, improper body mechanics, postural dysfunction, and pain.    ACTIVITY LIMITATIONS lifting, dressing, and reach over head   PARTICIPATION LIMITATIONS:  playing tennis   PERSONAL FACTORS Age, Fitness, and Profession are also affecting patient's functional outcome.    REHAB POTENTIAL: Good   CLINICAL DECISION MAKING: Stable/uncomplicated   EVALUATION COMPLEXITY: Low     GOALS: Goals reviewed with patient?  yes   SHORT TERM  GOALS:   Patient will be independent in self management strategies to improve quality of life and functional outcomes. Baseline: new program Target  date: 08/08/2021 Goal status: INITIAL   2.  Patient will report at least 50% improvement in overall symptoms and/or function to demonstrate improved functional mobility Baseline: 0% Target date: 08/08/2021 Goal status: INITIAL   3.  Patient will be able to demonstrate painfree shoulder AROM Baseline: painful Target date: 08/08/2021 Goal status: INITIAL         LONG TERM GOALS:   Patient will report at least 75% improvement in overall symptoms and/or function to demonstrate improved functional mobility Baseline: 0% Target date: 08/29/2021 Goal status: INITIAL   2.  Patient will be able to participate in tennis without difficulties or pain Baseline: painful Target date: 08/29/2021 Goal status: INITIAL   3.  Patient will demonstrate at least 4/5 MMT in bilateral shoulders Baseline: unable Target date: 08/29/2021 Goal status: INITIAL       PLAN: PT FREQUENCY: 2x/week   PT DURATION: 6 weeks   PLANNED INTERVENTIONS: Therapeutic exercises, Therapeutic activity, Neuromuscular re-education, Balance training, Gait training, Patient/Family education, Joint manipulation, Joint mobilization, Aquatic Therapy, Dry Needling, Cognitive remediation, Spinal manipulation, Cryotherapy, Moist heat, Vasopneumatic device, Traction, Ultrasound, Ionotophoresis '4mg'$ /ml Dexamethasone, and Manual therapy   PLAN FOR NEXT SESSION: lat stretch/pec stretch, row form with band and bent over - video for review, shoulder ER AP of shoulder, thoracic mobility   8:46 AM, 08/22/21 Jerene Pitch, DPT Physical Therapy with [facility]

## 2021-08-24 ENCOUNTER — Encounter: Payer: Self-pay | Admitting: Physical Therapy

## 2021-08-24 ENCOUNTER — Ambulatory Visit: Payer: Managed Care, Other (non HMO) | Admitting: Physical Therapy

## 2021-08-24 DIAGNOSIS — M25512 Pain in left shoulder: Secondary | ICD-10-CM | POA: Diagnosis not present

## 2021-08-24 DIAGNOSIS — M6281 Muscle weakness (generalized): Secondary | ICD-10-CM | POA: Diagnosis not present

## 2021-08-24 DIAGNOSIS — G8929 Other chronic pain: Secondary | ICD-10-CM | POA: Diagnosis not present

## 2021-08-24 NOTE — Therapy (Signed)
OUTPATIENT PHYSICAL THERAPY TREATMENT NOTE   Patient Name: Bradley Lawson MRN: [id number] DOB:February 04, 1981, 41 y.o., male Today's Date: 08/24/2021  END OF SESSION:   PT End of Session - 08/24/21 0800     Visit Number 8    Number of Visits 12    Date for PT Re-Evaluation 08/29/21    Authorization Type [employer]Medicare    PT Start Time 0802    PT Stop Time 0840    PT Time Calculation (min) 38 min             Past Medical History:  Diagnosis Date   Abnormal CT of the chest 12/13/2015   Formatting of this note might be different from the original. 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   Allergy    Anxiety    Chronic pain of both shoulders 11/23/2014   Colon polyps    Hyperlipidemia 11/12/2020   Migraine headache    Osteoarthritis    shoulder   Palpitation    cleared by cardiologist as benign   Shoulder pain    left   Past Surgical History:  Procedure Laterality Date   COLONOSCOPY     UPPER GASTROINTESTINAL ENDOSCOPY     Patient Active Problem List   Diagnosis Date Noted   Palpitations 11/16/2020   Dyspnea on exertion 11/16/2020   Shoulder pain 11/12/2020   Osteoarthritis 11/12/2020   Migraine headache 11/12/2020   Colon polyps 11/12/2020   Anxiety 11/12/2020   Allergy 11/12/2020   Hyperlipidemia 11/12/2020   Intestinal metaplasia of stomach 10/01/2020   History of esophageal ulcer 06/30/2020   Hx of adenomatous colonic polyps 06/30/2020   Loose bowel movements 06/30/2020   History of Clostridioides difficile infection 12/01/2019   Adenomatous colon polyp 12/01/2019   Abnormal CT of the chest 12/13/2015   Localized primary osteoarthritis of shoulder regions, bilateral 11/23/2014   Chronic pain of both shoulders 11/23/2014   Chronic nonintractable headache 11/17/2014     PCP: Marrian Salvage   REFERRING PROVIDER: Lynne Leader, MD   REFERRING DIAG: [phone] (ICD-10-CM) - Chronic left  shoulder pain M79.661 (ICD-10-CM) - Right calf pain   THERAPY DIAG:  Chronic left shoulder pain   Right calf pain   Muscle weakness (generalized)   Rationale for Evaluation and Treatment Rehabilitation   ONSET DATE: chronic 1.5 years in right    SUBJECTIVE:                                                                                                                                                                                       SUBJECTIVE STATEMENT: 08/24/2021 States  has  been doing exercises and thinks they are helping. States that he tried the pec wall stretch and it hurt.   Eval: States that he tore his calf 1.5 years ago and he had a blood clot in his calf and now it is good. States that for a full year he couldn't do much. States that when he runs he gets tightness in his calf.    States that he has had shoulder pain and he can't do shoulder presses. States when he plays tennis he has pain after serving. States that this has been a chronic thing but the last month he has been doing high rows and now it is more painful and it is no longer going away. Xray clear. States that he does a set routine at home with shoulders but the high row was new. Pain is in the front of the left shoulder.    Has had PT before for flexibility.  PERTINENT HISTORY: No significant history.    PAIN:  Are you having pain? no: NPRS scale: 1/10 Pain location: front and top of left shoulder Pain description: occ. sharp pain Aggravating factors: reaching behind and repetitious OH reaching Relieving factors: nothing, rest   PRECAUTIONS: None   WEIGHT BEARING RESTRICTIONS No   FALLS:  Has patient fallen in last 6 months? 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,   PLOF: Independent   PATIENT GOALS Would like to be able to play tennis for an hour without felling his calf tightness, to have no left shoulder pain    OBJECTIVE:    DIAGNOSTIC FINDINGS:  Xray of left shoulder  - no significant findings- mild OA        COGNITION:           Overall cognitive status: Within functional limits for tasks assessed                                  SENSATION: WFL   POSTURE: Forward head, rounded shoulders, stiff upper back, arms can't come to sides (restricted lats)                UE Measurements       Upper Extremity Right 07/18/2021 Left 07/18/2021    A/PROM MMT A/PROM MMT  Shoulder Flexion 150**   160    Shoulder Extension          Shoulder Abduction 140* 5 140* 4+*  Shoulder Adduction          Shoulder Internal Rotation Reaches to right low back* 4+ Reaches to T12 SP 4+  Shoulder External Rotation Reaches to C7SP 4- Reaches to C7SP 4-  Elbow Flexion          Elbow Extension          Wrist Flexion          Wrist Extension          Wrist Supination          Wrist Pronation          Wrist Ulnar Deviation          Wrist Radial Deviation          Grip Strength NA   NA                          (Blank rows = not tested)                       *  pain * scaption                LE Measurements       Lower Extremity Right 07/18/2021 Left 07/18/2021    A/PROM MMT A/PROM MMT  Hip Flexion          Hip Extension          Hip Abduction          Hip Adduction          Hip Internal rotation          Hip External rotation          Knee Flexion          Knee Extension          Ankle Dorsiflexion          Ankle Plantarflexion   4+   5  Ankle Inversion          Ankle Eversion           (Blank rows = not tested)            * pain   JOINT MOBILITY TESTING:  Hypomobility in B shoulders, thoracic spine   PALPATION:  Tenderness to palpation along thoracic paraspinals, rhomboids, UT             TODAY'S TREATMENT:  08/24/2021 Therapeutic Exercise:  supine towel roll down back 5 minutes, rocking side to side 5 minutes, shoulder mobility 5 minutes, cervical rotation on towel 6 minutes Standing pec stretch wall 5 minutes,  Seated pec stretch 3 minutes   Previous  interventions:  Neuromuscular Re-education: Therapeutic Activity: Self Care: Trigger Point Dry Needling:  Modalities:        PATIENT EDUCATION:  Education details:on different interventions for neck and thoracic mobility, on types of headaches on identifying triggers to symptoms/headaches Person educated: Patient Education method: Explanation, Demonstration, and Handouts Education comprehension: verbalized understanding       HOME EXERCISE PROGRAM: [id number]   ASSESSMENT:   CLINICAL IMPRESSION: 08/24/2021 Session focused on thoracic and cervical mobility. Educated patient on types of headaches as well as causes for headaches. Answered all questions and discussed frequency of interventions to help with migraines. Reduced tension noted end of session, added all new exercises to HEP  Eval: Patient is a 41 y.o. male who was seen today for physical therapy evaluation and treatment for left shoulder pain and right calf pain. Patient presents with hypomobilities in thoracic spine and shoulder that are currently contributing to his pain presentation. Patient is primarily sedentary during the day working at a desk which is likely contributing to his presentation. Educated patient in importance of mobility, POC and HEP. Patient would greatly benefit from skilled PT to improve overall function and QOL.      OBJECTIVE IMPAIRMENTS decreased activity tolerance, decreased mobility, decreased ROM, decreased strength, impaired flexibility, impaired UE functional use, improper body mechanics, postural dysfunction, and pain.    ACTIVITY LIMITATIONS lifting, dressing, and reach over head   PARTICIPATION LIMITATIONS:  playing tennis   PERSONAL FACTORS Age, Fitness, and Profession are also affecting patient's functional outcome.    REHAB POTENTIAL: Good   CLINICAL DECISION MAKING: Stable/uncomplicated   EVALUATION COMPLEXITY: Low     GOALS: Goals reviewed with patient?  yes   SHORT TERM  GOALS:   Patient will be independent in self management strategies to improve quality of life and functional outcomes. Baseline: new program Target date: 08/08/2021 Goal status: INITIAL   2.  Patient will  report at least 50% improvement in overall symptoms and/or function to demonstrate improved functional mobility Baseline: 0% Target date: 08/08/2021 Goal status: INITIAL   3.  Patient will be able to demonstrate painfree shoulder AROM Baseline: painful Target date: 08/08/2021 Goal status: INITIAL         LONG TERM GOALS:   Patient will report at least 75% improvement in overall symptoms and/or function to demonstrate improved functional mobility Baseline: 0% Target date: 08/29/2021 Goal status: INITIAL   2.  Patient will be able to participate in tennis without difficulties or pain Baseline: painful Target date: 08/29/2021 Goal status: INITIAL   3.  Patient will demonstrate at least 4/5 MMT in bilateral shoulders Baseline: unable Target date: 08/29/2021 Goal status: INITIAL       PLAN: PT FREQUENCY: 2x/week   PT DURATION: 6 weeks   PLANNED INTERVENTIONS: Therapeutic exercises, Therapeutic activity, Neuromuscular re-education, Balance training, Gait training, Patient/Family education, Joint manipulation, Joint mobilization, Aquatic Therapy, Dry Needling, Cognitive remediation, Spinal manipulation, Cryotherapy, Moist heat, Vasopneumatic device, Traction, Ultrasound, Ionotophoresis '4mg'$ /ml Dexamethasone, and Manual therapy   PLAN FOR NEXT SESSION: lat stretch/pec stretch, row form with band and bent over - video for review, shoulder ER AP of shoulder, thoracic mobility   8:43 AM, 08/24/21 Jerene Pitch, DPT Physical Therapy with [facility]

## 2021-08-29 ENCOUNTER — Encounter: Payer: Self-pay | Admitting: Physical Therapy

## 2021-08-29 ENCOUNTER — Ambulatory Visit: Payer: Managed Care, Other (non HMO) | Admitting: Physical Therapy

## 2021-08-29 DIAGNOSIS — M6281 Muscle weakness (generalized): Secondary | ICD-10-CM

## 2021-08-29 DIAGNOSIS — M25512 Pain in left shoulder: Secondary | ICD-10-CM

## 2021-08-29 DIAGNOSIS — G8929 Other chronic pain: Secondary | ICD-10-CM | POA: Diagnosis not present

## 2021-08-29 NOTE — Therapy (Signed)
OUTPATIENT PHYSICAL THERAPY TREATMENT NOTE and Discharge Note   PHYSICAL THERAPY DISCHARGE SUMMARY  Visits from Start of Care: 9  Current functional level related to goals / functional outcomes: All but one long and one short term goal met   Remaining deficits: Pain/ROM   Education / Equipment: See below   Patient agrees to discharge. Patient goals were partially met. Patient is being discharged due to being pleased with the current functional level.  Patient Name: Jevonte Clanton MRN: [id number] DOB:24-Aug-1980, 41 y.o., male Today's Date: 08/29/2021  END OF SESSION:   PT End of Session - 08/29/21 0849     Visit Number 9    Number of Visits 12    Date for PT Re-Evaluation 08/29/21    Authorization Type [employer]Medicare    PT Start Time [phone]    PT Stop Time 0920    PT Time Calculation (min) 31 min             Past Medical History:  Diagnosis Date   Abnormal CT of the chest 12/13/2015   Formatting of this note might be different from the original. 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   Allergy    Anxiety    Chronic pain of both shoulders 11/23/2014   Colon polyps    Hyperlipidemia 11/12/2020   Migraine headache    Osteoarthritis    shoulder   Palpitation    cleared by cardiologist as benign   Shoulder pain    left   Past Surgical History:  Procedure Laterality Date   COLONOSCOPY     UPPER GASTROINTESTINAL ENDOSCOPY     Patient Active Problem List   Diagnosis Date Noted   Palpitations 11/16/2020   Dyspnea on exertion 11/16/2020   Shoulder pain 11/12/2020   Osteoarthritis 11/12/2020   Migraine headache 11/12/2020   Colon polyps 11/12/2020   Anxiety 11/12/2020   Allergy 11/12/2020   Hyperlipidemia 11/12/2020   Intestinal metaplasia of stomach 10/01/2020   History of esophageal ulcer 06/30/2020   Hx of adenomatous colonic polyps 06/30/2020   Loose bowel movements 06/30/2020   History of  Clostridioides difficile infection 12/01/2019   Adenomatous colon polyp 12/01/2019   Abnormal CT of the chest 12/13/2015   Localized primary osteoarthritis of shoulder regions, bilateral 11/23/2014   Chronic pain of both shoulders 11/23/2014   Chronic nonintractable headache 11/17/2014     PCP: Marrian Salvage   REFERRING PROVIDER: Lynne Leader, MD   REFERRING DIAG: [phone] (ICD-10-CM) - Chronic left shoulder pain M79.661 (ICD-10-CM) - Right calf pain   THERAPY DIAG:  Chronic left shoulder pain   Right calf pain   Muscle weakness (generalized)   Rationale for Evaluation and Treatment Rehabilitation   ONSET DATE: chronic 1.5 years in right    SUBJECTIVE:  SUBJECTIVE STATEMENT: 08/29/2021 States that his shoulder is feeling better. States that he has been working out without pain. States overall he feels 60-80% better depends on what he is doing and on the day.   Eval: States that he tore his calf 1.5 years ago and he had a blood clot in his calf and now it is good. States that for a full year he couldn't do much. States that when he runs he gets tightness in his calf.    States that he has had shoulder pain and he can't do shoulder presses. States when he plays tennis he has pain after serving. States that this has been a chronic thing but the last month he has been doing high rows and now it is more painful and it is no longer going away. Xray clear. States that he does a set routine at home with shoulders but the high row was new. Pain is in the front of the left shoulder.    Has had PT before for flexibility.  PERTINENT HISTORY: No significant history.    PAIN:  Are you having pain? no: NPRS scale: 1/10 Pain location: front and top of left shoulder Pain description: occ. sharp  pain Aggravating factors: reaching behind and repetitious OH reaching Relieving factors: nothing, rest   PRECAUTIONS: None   WEIGHT BEARING RESTRICTIONS No   FALLS:  Has patient fallen in last 6 months? 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,   PLOF: Independent   PATIENT GOALS Would like to be able to play tennis for an hour without felling his calf tightness, to have no left shoulder pain    OBJECTIVE:    DIAGNOSTIC FINDINGS:  Xray of left shoulder - no significant findings- mild OA        COGNITION:           Overall cognitive status: Within functional limits for tasks assessed                                  SENSATION: WFL   POSTURE: Forward head, rounded shoulders, stiff upper back, arms can't come to sides (restricted lats)                UE Measurements       Upper Extremity Right 08/29/21 Left 08/29/21    A/PROM MMT A/PROM MMT  Shoulder Flexion 160  5 160*  5  Shoulder Extension          Shoulder Abduction 150 5 150* 4+  Shoulder Adduction          Shoulder Internal Rotation Reaches to right low back* 4+ Reaches to T8 SP 4+  Shoulder External Rotation Reaches to T1SPSP 4+ Reaches to C7SP 4+  Elbow Flexion          Elbow Extension          Wrist Flexion          Wrist Extension          Wrist Supination          Wrist Pronation          Wrist Ulnar Deviation          Wrist Radial Deviation          Grip Strength NA   NA                          (  Blank rows = not tested)                       * pain * scaption      JOINT MOBILITY TESTING:  Hypomobility in B shoulders, thoracic spine   PALPATION:  Tenderness to palpation along thoracic paraspinals, rhomboids, UT             TODAY'S TREATMENT:  08/29/2021 Therapeutic Exercise: HEP - reviewed and discussed progressions and return to previous HEP   Previous interventions:  Neuromuscular Re-education: Therapeutic Activity: Self Care: Trigger Point Dry Needling:  Modalities:         PATIENT EDUCATION:  Education details:on current presentation, on HEP, on progression of HEP, on return to tennis and PLOF Person educated: Patient Education method: Explanation, Demonstration, and Handouts Education comprehension: verbalized understanding       HOME EXERCISE PROGRAM: [id number]   ASSESSMENT:   CLINICAL IMPRESSION: 08/29/2021 Overall patient has made great progress. He continues to have pain but it is not as constant and he is able to lift heavier weights with his new exercises. Session focused on education and progressing HEP. Patient to discharge from PT at this time to continue to focus on strengthening/movement mechanics with HEP. All questions answered on this date.   Eval: Patient is a 41 y.o. male who was seen today for physical therapy evaluation and treatment for left shoulder pain and right calf pain. Patient presents with hypomobilities in thoracic spine and shoulder that are currently contributing to his pain presentation. Patient is primarily sedentary during the day working at a desk which is likely contributing to his presentation. Educated patient in importance of mobility, POC and HEP. Patient would greatly benefit from skilled PT to improve overall function and QOL.      OBJECTIVE IMPAIRMENTS decreased activity tolerance, decreased mobility, decreased ROM, decreased strength, impaired flexibility, impaired UE functional use, improper body mechanics, postural dysfunction, and pain.    ACTIVITY LIMITATIONS lifting, dressing, and reach over head   PARTICIPATION LIMITATIONS:  playing tennis   PERSONAL FACTORS Age, Fitness, and Profession are also affecting patient's functional outcome.    REHAB POTENTIAL: Good   CLINICAL DECISION MAKING: Stable/uncomplicated   EVALUATION COMPLEXITY: Low     GOALS: Goals reviewed with patient?  yes   SHORT TERM GOALS:   Patient will be independent in self management strategies to improve quality of life  and functional outcomes. Baseline: new program Target date: 08/08/2021 Goal status: MET   2.  Patient will report at least 50% improvement in overall symptoms and/or function to demonstrate improved functional mobility Baseline: 0% Target date: 08/08/2021 Goal status: MET   3.  Patient will be able to demonstrate painfree shoulder AROM Baseline: painful Target date: 08/08/2021 Goal status: PARTIALLY MET         LONG TERM GOALS:   Patient will report at least 75% improvement in overall symptoms and/or function to demonstrate improved functional mobility Baseline: 0% Target date: 08/29/2021 Goal status: PARTIALLY MET   2.  Patient will be able to participate in tennis without difficulties or pain Baseline: painful Target date: 08/29/2021 Goal status: NOT MET   3.  Patient will demonstrate at least 4/5 MMT in bilateral shoulders Baseline: unable Target date: 08/29/2021 Goal status: MET       PLAN: PT FREQUENCY: 2x/week   PT DURATION: 6 weeks   PLANNED INTERVENTIONS: Therapeutic exercises, Therapeutic activity, Neuromuscular re-education, Balance training, Gait training, Patient/Family education, Joint manipulation, Joint mobilization,  Aquatic Therapy, Dry Needling, Cognitive remediation, Spinal manipulation, Cryotherapy, Moist heat, Vasopneumatic device, Traction, Ultrasound, Ionotophoresis 46m/ml Dexamethasone, and Manual therapy   PLAN FOR NEXT SESSION: DC to HEP  9:27 AM, 08/29/21 MJerene Pitch DPT Physical Therapy with CR[facility]ston Sinner

## 2021-08-31 ENCOUNTER — Encounter: Payer: Managed Care, Other (non HMO) | Admitting: Physical Therapy

## 2021-09-01 NOTE — Progress Notes (Signed)
I, Wendy Poet, LAT, ATC, am serving as scribe for Dr. Lynne Leader.  Bradley Lawson is a 41 y.o. male who presents to [facility] [facility] today for f/u of L shoulder pain due to subacromial bursitis.  He was last seen by Dr. Georgina Snell on 07/08/21 and was referred to PT of which he's completed 9 visits.  Today, pt reports that he feels PT is helping but notes that his L shoulder con't to hurt.  He con't to have pain every morning when he wakes up.  He finished PT this week and has been shown a HEP.  He states that he is worried about returning to tennis and notes difficulty w/ certain lifts at the gym.  Diagnostic testing: L shoulder XR- 07/08/21  Pertinent review of systems: No fevers or chills  Relevant historical information: Migraine headache.   Exam:  BP 140/90 (BP Location: Left Arm, Patient Position: Sitting, Cuff Size: Normal)   Pulse 65   Ht '5\' 9"'$  (1.753 m)   Wt 207 lb 6.4 oz (94.1 kg)   SpO2 96%   BMI 30.63 kg/m  General: Well Developed, well nourished, and in no acute distress.   MSK: Left shoulder: Normal-appearing normal motion pain with abduction.   Positive Hawkins and Neer's test.   Positive empty can test.   Negative Yergason's and speeds test.   Negative O'Brien's test.   Negative clunk and relocation test. Pulses capillary refill and sensation are intact distally.    Lab and Radiology Results  [place] LIMITED JOINT SPACE STRUCTURES UP LEFT(NO LINKED CHARGES)  Result Date: 08/23/2021 Diagnostic Limited MSK Ultrasound of: Left shoulder and right calf Left shoulder: Biceps tendon is intact normal. Subscapularis tendon intact Supraspinatus tendon is intact with mild subacromial bursitis. Infraspinatus tendon is intact. AC joint mild degenerative appearing   Right calf: Medial head previous tear is visible with scar formation present without remaining hematoma or seroma.   Impression: Left shoulder: Subacromial bursitis.  Right calf prior calf tear  now healed  DG Shoulder Left  Result Date: 07/09/2021 CLINICAL DATA:  Left shoulder pain for a few weeks. EXAM: LEFT SHOULDER - 2+ VIEW COMPARISON:  None Available. FINDINGS: Normal bone mineralization. No significant degenerative change within the glenohumeral or acromioclavicular joint. No acute fracture or dislocation. The visualized portion of the left lung is unremarkable. IMPRESSION: Normal left shoulder radiographs. Electronically Signed   By: Yvonne Kendall M.D.   On: 07/09/2021 14:59        Assessment and Plan: 41 y.o. male with left shoulder pain and continued dysfunction.  Pain thought to be due to shoulder impingement and rotator cuff tendinopathy.  He has been unable to return to his prior level of activity with tennis.  He has completed physical therapy and would like to avoid an injection for now.  Additionally he is willing to consider surgery if that could treat his problem definitively.  He may be a good candidate for decompression.  Plan for MRI to further evaluate the pain and dysfunction more thoroughly.  If surgery is a distinct possibility will refer directly to orthopedic surgery.  Patient will keep me updated.   PDMP not reviewed this encounter. Orders Placed This Encounter  Procedures   [place] LIMITED JOINT SPACE STRUCTURES UP LEFT(NO LINKED CHARGES)    Order Specific Question:   Reason for Exam (SYMPTOM  OR DIAGNOSIS REQUIRED)    Answer:   L shoulder pain    Order Specific Question:   Preferred  imaging location?    Answer:   [place]   MR SHOULDER LEFT WO CONTRAST    Standing Status:   Future    Standing Expiration Date:   09/03/2022    Order Specific Question:   What is the patient's sedation requirement?    Answer:   No Sedation    Order Specific Question:   Does the patient have a pacemaker or implanted devices?    Answer:   No    Order Specific Question:   Preferred imaging location?    Answer:   [facility] (table  limit-350lbs)   No orders of the defined types were placed in this encounter.    Discussed warning signs or symptoms. Please see discharge instructions. Patient expresses understanding.   The above documentation has been reviewed and is accurate and complete Lynne Leader, M.D.

## 2021-09-02 ENCOUNTER — Ambulatory Visit (INDEPENDENT_AMBULATORY_CARE_PROVIDER_SITE_OTHER): Payer: Managed Care, Other (non HMO) | Admitting: Family Medicine

## 2021-09-02 ENCOUNTER — Encounter: Payer: Self-pay | Admitting: Family Medicine

## 2021-09-02 ENCOUNTER — Ambulatory Visit: Payer: Self-pay

## 2021-09-02 VITALS — BP 140/90 | HR 65 | Ht 69.0 in | Wt 207.4 lb

## 2021-09-02 DIAGNOSIS — M25512 Pain in left shoulder: Secondary | ICD-10-CM | POA: Diagnosis not present

## 2021-09-02 DIAGNOSIS — G8929 Other chronic pain: Secondary | ICD-10-CM | POA: Diagnosis not present

## 2021-09-02 NOTE — Patient Instructions (Addendum)
Good to see you today.  I've ordered a L shoulder MRI.  [facility] will call you to schedule but please let us know if you don't hear from them in one week regarding scheduling.  Continue your home exercises per PT.  Follow-up: after MRI

## 2021-09-05 ENCOUNTER — Telehealth: Payer: Self-pay

## 2021-09-05 NOTE — Telephone Encounter (Signed)
8/25 previsit 9/1 EGD   The pt has been advised and will call if he has any concerns

## 2021-09-05 NOTE — Telephone Encounter (Signed)
Aug recall [facility] EGD gastric intestinal metaplasia with GM

## 2021-09-13 ENCOUNTER — Telehealth: Payer: Self-pay | Admitting: Family Medicine

## 2021-09-13 NOTE — Telephone Encounter (Signed)
Peer-to-peer completed for MRI authorization for the shoulder MRI. Authorization: [id number]

## 2021-09-20 ENCOUNTER — Ambulatory Visit (INDEPENDENT_AMBULATORY_CARE_PROVIDER_SITE_OTHER): Payer: Managed Care, Other (non HMO)

## 2021-09-20 DIAGNOSIS — M25512 Pain in left shoulder: Secondary | ICD-10-CM | POA: Diagnosis not present

## 2021-09-20 DIAGNOSIS — G8929 Other chronic pain: Secondary | ICD-10-CM

## 2021-09-21 NOTE — Progress Notes (Signed)
MRI shows mild rotator cuff tendinitis, and evidence of impingement.  You are pretty good candidate for cortisone shot.  If he cannot get better after that then surgery would make sense but based on this MRI you actually have a pretty good chance of getting better with the cortisone shot.  Recommend returning to clinic to discuss MRI results and potentially proceed to cortisone injection.

## 2021-10-04 ENCOUNTER — Ambulatory Visit (INDEPENDENT_AMBULATORY_CARE_PROVIDER_SITE_OTHER): Payer: Managed Care, Other (non HMO) | Admitting: Family Medicine

## 2021-10-04 ENCOUNTER — Ambulatory Visit: Payer: Self-pay

## 2021-10-04 VITALS — BP 158/92 | HR 82 | Ht 69.0 in | Wt 207.0 lb

## 2021-10-04 DIAGNOSIS — M25512 Pain in left shoulder: Secondary | ICD-10-CM | POA: Diagnosis not present

## 2021-10-04 DIAGNOSIS — G8929 Other chronic pain: Secondary | ICD-10-CM

## 2021-10-04 NOTE — Patient Instructions (Addendum)
Thank you for coming in today.   Continue working on the exercises at home.  Voltaren gel topically to the [facility] joint.   Let me know if you want an injection.

## 2021-10-04 NOTE — Progress Notes (Signed)
I, Peterson Lombard, LAT, ATC acting as a scribe for Lynne Leader, MD.  Bradley Lawson is a 41 y.o. male who presents to [place] at [facility] today for f/u L shoulder pain w/ MRI review. Pt was last seen by Dr. Georgina Snell on 09/02/21 and was advised to proceed to MRI. Pt previously completed a course of PT, 9 visits, d/c on 7/17. Today, pt reports L shoulder pain is still lingering. Pt c/o pain at night, but not as much during the day.   Dx imaging: 09/20/21 L shoulder MRI  07/08/21 L shoulder XR  Pertinent review of systems: No fevers or chills  Relevant historical information: Intestinal metaplasia.   Exam:  BP (!) 158/92   Pulse 82   Ht '5\' 9"'$  (1.753 m)   Wt 207 lb (93.9 kg)   SpO2 98%   BMI 30.57 kg/m  General: Well Developed, well nourished, and in no acute distress.   MSK: Left shoulder: Normal-appearing normal motion.  Pain with crossover arm compression test.    Lab and Radiology Results  EXAM: MRI OF THE LEFT SHOULDER WITHOUT CONTRAST   TECHNIQUE: Multiplanar, multisequence MR imaging of the shoulder was performed. No intravenous contrast was administered.   COMPARISON:  Left shoulder radiographs 07/08/2021   FINDINGS: Rotator cuff: Minimal mid and anterior supraspinatus intermediate T2 signal tendinosis. The infraspinatus, subscapularis, and teres minor are intact.   Muscles: No rotator cuff muscle atrophy, fatty infiltration, or edema.   Biceps long head: The intra-articular long head of the biceps tendon is intact.   Acromioclavicular Joint: There are very mild degenerative changes of the acromioclavicular joint including joint space narrowing, subchondral marrow edema, and peripheral osteophytosis. Type II acromion. The distal lateral subacromial space measures 6 mm, mildly decreased. No fluid within the subacromial/subdeltoid bursa.   Glenohumeral Joint: Intact cartilage.   Labrum: Grossly intact, but evaluation is limited by lack  of intraarticular fluid.   Bones:  No acute fracture.   Other: None.   IMPRESSION: 1. Minimal supraspinatus tendinosis.  No rotator cuff tear. 2. Very mild degenerative changes of the acromioclavicular joint with associated subchondral marrow edema which may represent a source of pain. 3. Mild narrowing of the distal lateral subacromial space. This can be seen with subacromial impingement. Recommend clinical correlation.     Electronically Signed   By: Yvonne Kendall M.D.   On: 09/20/2021 16:02    I, Lynne Leader, personally (independently) visualized and performed the interpretation of the images attached in this note.      Assessment and Plan: 41 y.o. male with left shoulder pain.  Etiology thought to be due primarily to [facility] joint pathology.  May have some component of subacromial impingement and rotator cuff pathology as well.  He has had a trial of subacromial injection with prior provider as well as physical therapy and still has pain.  Next step would probably be trial of AC joint injection.  He would like to hold off on that for now which I think is reasonable.  Plan for trial Voltaren gel and check back if not improving.  Total encounter time 20 minutes including face-to-face time with the patient and, reviewing past medical record, and charting on the date of service.   Reviewed MRI findings and images and discussed treatment plan options.   Discussed warning signs or symptoms. Please see discharge instructions. Patient expresses understanding.   The above documentation has been reviewed and is accurate and complete Lynne Leader, M.D.

## 2021-10-07 ENCOUNTER — Ambulatory Visit (AMBULATORY_SURGERY_CENTER): Payer: Managed Care, Other (non HMO)

## 2021-10-07 VITALS — Ht 70.0 in | Wt 205.0 lb

## 2021-10-07 DIAGNOSIS — K31A0 Gastric intestinal metaplasia, unspecified: Secondary | ICD-10-CM

## 2021-10-07 NOTE — Progress Notes (Signed)
No egg or soy allergy known to patient   No issues known to pt with past sedation with any surgeries or procedures  Patient denies ever being told they had issues or difficulty with intubation   No FH of Malignant Hyperthermia  Pt is not on diet pills  Pt is not on  home 02   Pt is not on blood thinners     No A fib or A flutter  Have any cardiac testing pending--no  Pt instructed to use Singlecare.com or GoodRx for a price reduction on prep    EGD 9-1 with GM  Telephone previsit

## 2021-10-09 ENCOUNTER — Encounter: Payer: Self-pay | Admitting: Certified Registered Nurse Anesthetist

## 2021-10-11 ENCOUNTER — Ambulatory Visit (INDEPENDENT_AMBULATORY_CARE_PROVIDER_SITE_OTHER): Payer: Managed Care, Other (non HMO) | Admitting: Family

## 2021-10-11 VITALS — BP 134/78 | HR 67 | Temp 98.3°F | Ht 70.0 in | Wt 207.2 lb

## 2021-10-11 DIAGNOSIS — H00011 Hordeolum externum right upper eyelid: Secondary | ICD-10-CM

## 2021-10-11 DIAGNOSIS — R002 Palpitations: Secondary | ICD-10-CM | POA: Diagnosis not present

## 2021-10-11 DIAGNOSIS — R030 Elevated blood-pressure reading, without diagnosis of hypertension: Secondary | ICD-10-CM | POA: Diagnosis not present

## 2021-10-11 LAB — COMPREHENSIVE METABOLIC PANEL
ALT: 27 U/L (ref 0–53)
AST: 19 U/L (ref 0–37)
Albumin: 4.6 g/dL (ref 3.5–5.2)
Alkaline Phosphatase: 46 U/L (ref 39–117)
BUN: 11 mg/dL (ref 6–23)
CO2: 28 mEq/L (ref 19–32)
Calcium: 9.8 mg/dL (ref 8.4–10.5)
Chloride: 104 mEq/L (ref 96–112)
Creatinine, Ser: 0.97 mg/dL (ref 0.40–1.50)
GFR: 98.66 mL/min (ref >60.00)
Glucose, Bld: 89 mg/dL (ref 70–99)
Potassium: 4.3 mEq/L (ref 3.5–5.1)
Sodium: 141 mEq/L (ref 135–145)
Total Bilirubin: 0.9 mg/dL (ref 0.2–1.2)
Total Protein: 7.1 g/dL (ref 6.0–8.3)

## 2021-10-11 LAB — CBC WITH DIFFERENTIAL/PLATELET
Basophils Absolute: 0 10*3/uL (ref 0.0–0.1)
Basophils Relative: 0.2 % (ref 0.0–3.0)
Eosinophils Absolute: 0.1 10*3/uL (ref 0.0–0.7)
Eosinophils Relative: 1.3 % (ref 0.0–5.0)
HCT: 45.8 % (ref 39.0–52.0)
Hemoglobin: 15.6 g/dL (ref 13.0–17.0)
Lymphocytes Relative: 28.4 % (ref 12.0–46.0)
Lymphs Abs: 2.2 10*3/uL (ref 0.7–4.0)
MCHC: 34.1 g/dL (ref 30.0–36.0)
MCV: 87.6 fl (ref 78.0–100.0)
Monocytes Absolute: 0.5 10*3/uL (ref 0.1–1.0)
Monocytes Relative: 6.3 % (ref 3.0–12.0)
Neutro Abs: 4.9 10*3/uL (ref 1.4–7.7)
Neutrophils Relative %: 63.8 % (ref 43.0–77.0)
Platelets: 227 10*3/uL (ref 150.0–400.0)
RBC: 5.22 Mil/uL (ref 4.22–5.81)
RDW: 12.8 % (ref 11.5–15.5)
WBC: 7.8 10*3/uL (ref 4.0–10.5)

## 2021-10-11 LAB — TSH: TSH: 1.28 u[IU]/mL (ref 0.35–5.50)

## 2021-10-11 MED ORDER — TOBRAMYCIN 0.3 % OP SOLN: 1.0000 [drp] | Freq: Four times a day (QID) | OPHTHALMIC | 0 refills | Status: DC

## 2021-10-11 NOTE — Patient Instructions (Signed)

## 2021-10-11 NOTE — Progress Notes (Signed)
Bradley Lawson is a 41 y.o. male with the following history as recorded in EpicCare:  Patient Active Problem List   Diagnosis Date Noted   Palpitations 11/16/2020   Dyspnea on exertion 11/16/2020   Shoulder pain 11/12/2020   Osteoarthritis 11/12/2020   Migraine headache 11/12/2020   Colon polyps 11/12/2020   Anxiety 11/12/2020   Allergy 11/12/2020   Hyperlipidemia 11/12/2020   Intestinal metaplasia of stomach 10/01/2020   History of esophageal ulcer 06/30/2020   Hx of adenomatous colonic polyps 06/30/2020   Loose bowel movements 06/30/2020   History of Clostridioides difficile infection 12/01/2019   Adenomatous colon polyp 12/01/2019   Abnormal CT of the chest 12/13/2015   Localized primary osteoarthritis of shoulder regions, bilateral 11/23/2014   Chronic pain of both shoulders 11/23/2014   Chronic nonintractable headache 11/17/2014    Current Outpatient Medications  Medication Sig Dispense Refill   clindamycin (CLEOCIN T) 1 % external solution Apply 1 application topically 2 (two) times daily.     diclofenac Sodium (VOLTAREN ARTHRITIS PAIN) 1 % GEL Apply topically 4 (four) times daily. Left shoulder     loratadine (CLARITIN) 10 MG tablet Take 10 mg by mouth daily. Seasonal allergies     LORazepam (ATIVAN) 0.5 MG tablet 1 tablet q 30-45 minutes prior to flight as needed (Patient taking differently: Take 0.5 mg by mouth See admin instructions. 1 tablet q 30-45 minutes prior to flight as needed) 20 tablet 1   QULIPTA 60 MG TABS TAKE 1 TABLET BY MOUTH EVERY DAY 30 tablet 5   tobramycin (TOBREX) 0.3 % ophthalmic solution Place 1 drop into the right eye every 6 (six) hours. 5 mL 0   Ubrogepant (UBRELVY) 100 MG TABS Take 1 tablet by mouth as needed (May repeat in 2 hours.  Maximum 2 tablets in 24 hours.). 16 tablet 5   No current facility-administered medications for this visit.    Allergies: Clindamycin/lincomycin and Sulfa antibiotics  Past Medical History:  Diagnosis Date    Abnormal CT of the chest 12/13/2015   Formatting of this note might be different from the original. 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   Allergy    seasonal   Anxiety    Chronic pain of both shoulders 11/23/2014   Colon polyps    Hyperlipidemia 11/12/2020   Migraine headache    Osteoarthritis    shoulder   Palpitation    cleared by cardiologist as benign   Shoulder pain    left    Past Surgical History:  Procedure Laterality Date   COLONOSCOPY     UPPER GASTROINTESTINAL ENDOSCOPY      Family History  Problem Relation Age of Onset   Colon polyps Mother    Irritable bowel syndrome Mother    Anemia Mother    Diverticulosis Father    Colon polyps Father    Irritable bowel syndrome Brother    Colon polyps Maternal Grandmother    Heart disease Maternal Grandfather    Heart attack Maternal Grandfather    Heart disease Paternal Grandmother        CAD   Stomach cancer Paternal Grandfather    Pancreatic cancer Neg Hx    Esophageal cancer Neg Hx    Liver disease Neg Hx    Colon cancer Neg Hx    Inflammatory bowel disease Neg Hx    Rectal cancer Neg Hx     Social History   Tobacco Use   Smoking status:  Never   Smokeless tobacco: Never  Substance Use Topics   Alcohol use: Yes    Comment: 2-3 on weekends    Subjective:   Patient was seen at sports medicine recently and his blood pressure was noted to be elevated; no prior history of hypertension; admits that summer was not as active as "normal" for him due to shoulder pain; Denies any chest pain, shortness of breath, blurred vision or headache.  Also complaining of stye on right upper lid x 2-3 weeks; limited response to warm compresses;      Objective:  Vitals:   10/11/21 0822  BP: 134/78  Pulse: 67  Temp: 98.3 F (36.8 C)  TempSrc: Oral  SpO2: 98%  Weight: 207 lb 3.2 oz (94 kg)  Height: _0  (1.778 m)    General: Well developed, well  nourished, in no acute distress  Skin : Warm and dry.  Head: Normocephalic and atraumatic  Eyes: Sclera and conjunctiva clear; pupils round and reactive to light; extraocular movements intact  Ears: External normal; canals clear; tympanic membranes normal  Oropharynx: Pink, supple. No suspicious lesions  Neck: Supple without thyromegaly, adenopathy  Lungs: Respirations unlabored; clear to auscultation bilaterally without wheeze, rales, rhonchi  CVS exam: normal rate and regular rhythm.  Neurologic: Alert and oriented; speech intact; face symmetrical; moves all extremities well; CNII-XII intact without focal deficit   Assessment:  1. Palpitations   2. Elevated blood pressure reading   3. Hordeolum externum of right upper eyelid     Plan:  Has been evaluated and cleared by cardiology; update labs today; OMRON cuff recommended/ DASH diet discussed; he will forward blood pressure readings to the office for review; 3.   Encouraged to continue applying warm compresses; Rx for Tobramycin- allergy reviewed; may need to refer to eye doctor for further evaluation;   No follow-ups on file.  Orders Placed This Encounter  Procedures   CBC with Differential/Platelet   Comp Met (CMET)   TSH    Requested Prescriptions   Signed Prescriptions Disp Refills   tobramycin (TOBREX) 0.3 % ophthalmic solution 5 mL 0    Sig: Place 1 drop into the right eye every 6 (six) hours.

## 2021-10-14 ENCOUNTER — Ambulatory Visit: Payer: Managed Care, Other (non HMO) | Admitting: Gastroenterology

## 2021-10-14 ENCOUNTER — Encounter: Payer: Self-pay | Admitting: Gastroenterology

## 2021-10-14 VITALS — BP 129/81 | HR 72 | Temp 98.2°F | Resp 20 | Ht 70.0 in | Wt 205.0 lb

## 2021-10-14 DIAGNOSIS — K31A0 Gastric intestinal metaplasia, unspecified: Secondary | ICD-10-CM | POA: Diagnosis not present

## 2021-10-14 DIAGNOSIS — Z0389 Encounter for observation for other suspected diseases and conditions ruled out: Secondary | ICD-10-CM | POA: Diagnosis not present

## 2021-10-14 MED ORDER — SODIUM CHLORIDE 0.9 % IV SOLN: 500.0000 mL | Freq: Once | INTRAVENOUS | Status: DC

## 2021-10-14 NOTE — Progress Notes (Unsigned)
Called to room to assist during endoscopic procedure.  Patient ID and intended procedure confirmed with present staff. Received instructions for my participation in the procedure from the performing physician.  

## 2021-10-14 NOTE — Progress Notes (Unsigned)
GASTROENTEROLOGY PROCEDURE H&P NOTE   Primary Care Physician: Marrian Salvage, FNP  HPI: Bradley Lawson is a 41 y.o. male who presents for EGD for gastric intestinal metaplasia mapping and follow-up of prior ulcers.  Past Medical History:  Diagnosis Date   Abnormal CT of the chest 12/13/2015   Formatting of this note might be different from the original. 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 Left shoulder     QULIPTA 60 MG TABS TAKE 1 TABLET BY MOUTH EVERY DAY 30 tablet 5   tobramycin (TOBREX) 0.3 % ophthalmic solution Place 1 drop into the right eye every 6 (six) hours. 5 mL 0   clindamycin (CLEOCIN T) 1 % external solution Apply 1 application topically 2 (two) times daily.     loratadine (CLARITIN) 10 MG tablet Take 10 mg by mouth daily. Seasonal allergies     LORazepam (ATIVAN) 0.5 MG tablet 1 tablet q 30-45 minutes prior to flight as needed (Patient taking differently: Take 0.5 mg by mouth See admin instructions. 1 tablet q 30-45 minutes prior to flight as needed) 20 tablet 1   Ubrogepant (UBRELVY) 100 MG TABS Take 1 tablet by mouth as needed (May repeat in 2 hours.  Maximum 2 tablets in 24 hours.). 16 tablet 5   Current Facility-Administered Medications  Medication Dose Route Frequency Provider Last Rate Last Admin   0.9 %  sodium chloride infusion  500 mL Intravenous Once Mansouraty, Telford Nab.,  MD        Current Outpatient Medications:    diclofenac Sodium (VOLTAREN ARTHRITIS PAIN) 1 % GEL, Apply topically 4 (four) times daily. Left shoulder, Disp: , Rfl:    QULIPTA 60 MG TABS, TAKE 1 TABLET BY MOUTH EVERY DAY, Disp: 30 tablet, Rfl: 5   tobramycin (TOBREX) 0.3 % ophthalmic solution, Place 1 drop into the right eye every 6 (six) hours., Disp: 5 mL, Rfl: 0   clindamycin (CLEOCIN T) 1 % external solution, Apply 1 application topically 2 (two) times daily., Disp: , Rfl:    loratadine (CLARITIN) 10 MG tablet, Take 10 mg by mouth daily. Seasonal allergies, Disp: , Rfl:    LORazepam (ATIVAN) 0.5 MG tablet, 1 tablet q 30-45 minutes prior to flight as needed (Patient taking differently: Take 0.5 mg by mouth See admin instructions. 1 tablet q 30-45 minutes prior to flight as needed), Disp: 20 tablet, Rfl: 1   Ubrogepant (UBRELVY) 100 MG TABS, Take 1 tablet by mouth as needed (May repeat in 2 hours.  Maximum 2 tablets in 24 hours.)., Disp: 16 tablet, Rfl: 5  Current Facility-Administered Medications:    0.9 %  sodium chloride infusion, 500 mL, Intravenous, Once, Mansouraty, Telford Nab., MD Allergies  Allergen Reactions   Clindamycin/Lincomycin    Sulfa Antibiotics    Family History  Problem Relation Age of Onset  Colon polyps Mother    Irritable bowel syndrome Mother    Anemia Mother    Diverticulosis Father    Colon polyps Father    Irritable bowel syndrome Brother    Colon polyps Maternal Grandmother    Heart disease Maternal Grandfather    Heart attack Maternal Grandfather    Heart disease Paternal Grandmother        CAD   Stomach cancer Paternal Grandfather    Pancreatic cancer Neg Hx    Esophageal cancer Neg Hx    Liver disease Neg Hx    Colon cancer Neg Hx    Inflammatory bowel disease Neg Hx    Rectal cancer Neg Hx    Social History   Socioeconomic History   Marital status: Married    Spouse name: Not on file   Number of children: 2   Years of education: 49    Highest education level: Not on file  Occupational History   Occupation: Optometrist  Tobacco Use   Smoking status: Never   Smokeless tobacco: Never  Vaping Use   Vaping Use: Never used  Substance and Sexual Activity   Alcohol use: Yes    Comment: 2-3 on weekends   Drug use: Never   Sexual activity: Yes    Partners: Female  Other Topics Concern   Not on file  Social History Narrative   Right handed   Drinks caffeine    Two story home   Social Determinants of Health   Financial Resource Strain: Not on file  Food Insecurity: Not on file  Transportation Needs: Not on file  Physical Activity: Not on file  Stress: Not on file  Social Connections: Not on file  Intimate Partner Violence: Not on file    Physical Exam: Today's Vitals   10/14/21 1254 10/14/21 1256  BP: 132/85   Pulse: 82   Temp: 98.2 F (36.8 C) 98.2 F (36.8 C)  TempSrc: Temporal   SpO2: 99%   Weight: 205 lb (93 kg)   Height: '5\' 10"'$  (1.778 m)    Body mass index is 29.41 kg/m. GEN: NAD EYE: Sclerae anicteric ENT: MMM CV: Non-tachycardic GI: Soft, NT/ND NEURO:  Alert & Oriented x 3  Lab Results: No results for input(s): "WBC", "HGB", "HCT", "PLT" in the last 72 hours. BMET No results for input(s): "NA", "K", "CL", "CO2", "GLUCOSE", "BUN", "CREATININE", "CALCIUM" in the last 72 hours. LFT No results for input(s): "PROT", "ALBUMIN", "AST", "ALT", "ALKPHOS", "BILITOT", "BILIDIR", "IBILI" in the last 72 hours. PT/INR No results for input(s): "LABPROT", "INR" in the last 72 hours.   Impression / Plan: This is a 41 y.o.male who presents for EGD for gastric intestinal metaplasia mapping and follow-up of prior ulcers.  The risks and benefits of endoscopic evaluation/treatment were discussed with the patient and/or family; these include but are not limited to the risk of perforation, infection, bleeding, missed lesions, lack of diagnosis, severe illness requiring hospitalization, as well as anesthesia  and sedation related illnesses.  The patient's history has been reviewed, patient examined, no change in status, and deemed stable for procedure.  The patient and/or family is agreeable to proceed.    Justice Britain, MD [facility] Office # [phone]

## 2021-10-14 NOTE — Op Note (Signed)
[place] Patient Name: Bradley Lawson Procedure Date: 10/14/2021 1:40 PM MRN: [id number] Endoscopist: Justice Britain , MD Age: 41 Referring MD:  Date of Birth: 1980-07-05 Gender: Male Account #: [id number] Procedure:                Upper GI endoscopy Indications:              Intestinal metaplasia, Follow-up of intestinal                            metaplasia Medicines:                Monitored Anesthesia Care Procedure:                Pre-Anesthesia Assessment:                           - Prior to the procedure, a History and Physical                            was performed, and patient medications and                            allergies were reviewed. The patient's tolerance of                            previous anesthesia was also reviewed. The risks                            and benefits of the procedure and the sedation                            options and risks were discussed with the patient.                            All questions were answered, and informed consent                            was obtained. Prior Anticoagulants: The patient has                            taken no previous anticoagulant or antiplatelet                            agents. ASA Grade Assessment: II - A patient with                            mild systemic disease. After reviewing the risks                            and benefits, the patient was deemed in                            satisfactory condition to undergo the procedure.  After obtaining informed consent, the endoscope was                            passed under direct vision. Throughout the                            procedure, the patient's blood pressure, pulse, and                            oxygen saturations were monitored continuously. The                            GIF [id number] #[id number] was introduced through the                            mouth, and advanced to the second part of  duodenum.                            The upper GI endoscopy was accomplished without                            difficulty. The patient tolerated the procedure. Scope In: Scope Out: Findings:                 No gross lesions were noted in the entire esophagus.                           The Z-line was regular and was found 41 cm from the                            incisors.                           Minimal patchy mildly erythematous mucosa without                            bleeding was found in the entire examined stomach.                            Due to history of gastric intestinal metaplasia                            biopsies were taken with a cold forceps for                            histology from the cardia/fundus. Biopsies were                            taken with a cold forceps for histology from the                            greater curve/lesser curve. Biopsies were taken  with a cold forceps for histology from the                            antrum/incisura.                           No gross lesions were noted in the duodenal bulb,                            in the first portion of the duodenum and in the                            second portion of the duodenum. Complications:            No immediate complications. Estimated Blood Loss:     Estimated blood loss was minimal. Impression:               - No gross lesions in esophagus. Z-line regular, 41                            cm from the incisors.                           - Erythematous mucosa in the stomach. Biopsied.                           - No gross lesions in the duodenal bulb, in the                            first portion of the duodenum and in the second                            portion of the duodenum. Recommendation:           - The patient will be observed post-procedure,                            until all discharge criteria are met.                           - Discharge  patient to home.                           - Patient has a contact number available for                            emergencies. The signs and symptoms of potential                            delayed complications were discussed with the                            patient. Return to normal activities tomorrow.  Written discharge instructions were provided to the                            patient.                           - High fiber diet.                           - Use FiberCon 1-2 tablets PO daily.                           - Continue present medications.                           - Await pathology results.                           - Repeat upper endoscopy in likely 3 years for                            surveillance.                           - The findings and recommendations were discussed                            with the patient.                           - The findings and recommendations were discussed                            with the patient's family. Justice Britain, MD 10/14/2021 2:05:13 PM

## 2021-10-14 NOTE — Progress Notes (Unsigned)
1339 Robinul 0.1 mg IV given due large amount of secretions upon assessment.  MD made aware, vss 

## 2021-10-14 NOTE — Progress Notes (Signed)
Pt's states no medical or surgical changes since previsit or office visit. 

## 2021-10-14 NOTE — Patient Instructions (Signed)
Follow a high fiber diet. Use FiberCon 1-2 tablets daily by mouth. Awaiting pathology results. Repeat upper endoscopy in 3 years for surveillance.  YOU HAD AN ENDOSCOPIC PROCEDURE TODAY AT [facility]:   Refer to the procedure report that was given to you for any specific questions about what was found during the examination.  If the procedure report does not answer your questions, please call your gastroenterologist to clarify.  If you requested that your care partner not be given the details of your procedure findings, then the procedure report has been included in a sealed envelope for you to review at your convenience later.  YOU SHOULD EXPECT: Some feelings of bloating in the abdomen. Passage of more gas than usual.  Walking can help get rid of the air that was put into your GI tract during the procedure and reduce the bloating. If you had a lower endoscopy (such as a colonoscopy or flexible sigmoidoscopy) you may notice spotting of blood in your stool or on the toilet paper. If you underwent a bowel prep for your procedure, you may not have a normal bowel movement for a few days.  Please Note:  You might notice some irritation and congestion in your nose or some drainage.  This is from the oxygen used during your procedure.  There is no need for concern and it should clear up in a day or so.  SYMPTOMS TO REPORT IMMEDIATELY:  Following upper endoscopy (EGD)  Vomiting of blood or coffee ground material  New chest pain or pain under the shoulder blades  Painful or persistently difficult swallowing  New shortness of breath  Fever of 100F or higher  Black, tarry-looking stools  For urgent or emergent issues, a gastroenterologist can be reached at any hour by calling [phone]. Do not use MyChart messaging for urgent concerns.    DIET:  We do recommend a small meal at first, but then you may proceed to your regular diet.  Drink plenty of fluids but you should avoid  alcoholic beverages for 24 hours.  ACTIVITY:  You should plan to take it easy for the rest of today and you should NOT DRIVE or use heavy machinery until tomorrow (because of the sedation medicines used during the test).    FOLLOW UP: Our staff will call the number listed on your records the next business day following your procedure.  We will call around 7:15- 8:00 am to check on you and address any questions or concerns that you may have regarding the information given to you following your procedure. If we do not reach you, we will leave a message.  If you develop any symptoms (ie: fever, flu-like symptoms, shortness of breath, cough etc.) before then, please call [phone].  If you test positive for Covid 19 in the 2 weeks post procedure, please call and report this information to [place].    If any biopsies were taken you will be contacted by phone or by letter within the next 1-3 weeks.  Please call us at [phone] if you have not heard about the biopsies in 3 weeks.    SIGNATURES/CONFIDENTIALITY: You and/or your care partner have signed paperwork which will be entered into your electronic medical record.  These signatures attest to the fact that that the information above on your After Visit Summary has been reviewed and is understood.  Full responsibility of the confidentiality of this discharge information lies with you and/or your care-partner.

## 2021-10-18 ENCOUNTER — Telehealth: Payer: Self-pay | Admitting: *Deleted

## 2021-10-18 NOTE — Telephone Encounter (Signed)
  Follow up Call-     10/14/2021   12:56 PM 10/01/2020    8:14 AM  Call back number  Post procedure Call Back phone  # [phone]  Permission to leave phone message Yes Yes     Patient questions:  Do you have a fever, pain , or abdominal swelling? No. Pain Score  0 *  Have you tolerated food without any problems? Yes.    Have you been able to return to your normal activities? Yes.    Do you have any questions about your discharge instructions: Diet   No. Medications  No. Follow up visit  No.  Do you have questions or concerns about your Care? No.  Actions: * If pain score is 4 or above: No action needed, pain <4.

## 2021-10-21 ENCOUNTER — Encounter: Payer: Self-pay | Admitting: Gastroenterology

## 2021-11-22 ENCOUNTER — Encounter: Payer: Self-pay | Admitting: Family

## 2021-11-23 ENCOUNTER — Other Ambulatory Visit: Payer: Self-pay | Admitting: Family

## 2021-11-23 MED ORDER — VALACYCLOVIR HCL 1 G PO TABS: 1000.0000 mg | ORAL_TABLET | Freq: Two times a day (BID) | ORAL | 0 refills | Status: AC

## 2021-11-23 MED ORDER — LIDOCAINE VISCOUS HCL 2 % MT SOLN: 15.0000 mL | Freq: Four times a day (QID) | OROMUCOSAL | 0 refills | Status: DC | PRN

## 2022-02-04 ENCOUNTER — Encounter: Payer: Self-pay | Admitting: Family

## 2022-02-05 ENCOUNTER — Telehealth: Payer: Managed Care, Other (non HMO) | Admitting: Family Medicine

## 2022-02-05 DIAGNOSIS — U071 COVID-19: Secondary | ICD-10-CM | POA: Diagnosis not present

## 2022-02-05 NOTE — Patient Instructions (Signed)
Quarantine and Isolation Quarantine and isolation refer to local and travel restrictions to protect the public and travelers from contagious diseases that constitute a public health threat. Contagious diseases are diseases that can spread from one person to another. Quarantine and isolation help to protect the public by preventing exposure to people who have or may have a contagious disease. Isolation separates people who are sick with a contagious disease from people who are not sick. Quarantine separates and restricts the movement of people who were exposed to a contagious disease to see if they become sick. You may be put in quarantine or isolation if you have been exposed to or diagnosed with any of the following diseases: Severe acute respiratory syndromes, such as COVID-19. Cholera. Diphtheria. Tuberculosis. Plague. Smallpox. Yellow fever. Viral hemorrhagic fevers, such as [place], Ebola, and [place]. When to quarantine or isolate Follow these rules, whether you have been vaccinated or not: Stay home and isolate from others when you are sick with a contagious disease. Isolate when you test positive for a contagious disease, even if you do not have symptoms. Isolate if you are sick and suspect that you may have a contagious disease. If you suspect that you have a contagious disease, get tested. If your test results are negative, you can end your isolation. If your test results are positive, follow the full isolation recommendations as told by your health care provider or local health authorities. Quarantine and stay away from others when you have been in close contact with someone who has tested positive for a contagious disease. Close contact is defined as being less than 6 ft (1.8 m) away from an infected person for a total of 15 minutes or more over a 24-hour period. Do not go to places where you are unable to wear a mask, such as restaurants and some gyms. Stay home and separate  from others as much as possible. Avoid being around people who may get very sick from the contagious disease that you have. Use a separate bathroom, if possible. Do not travel. For travel guidance, visit the CDC's travel webpage at wwwnc.cdc.gov/travel/ Follow these instructions at home: Medicines  Take over-the-counter and prescription medicines as told by your health care provider. Finish all antibiotic medicine even when you start to feel better. Stay up to date with all your vaccines. Get scheduled vaccines and boosters as recommended by your health care provider. Lifestyle Wear a high-quality mask if you must be around others at home and in public, if recommended. Improve air flow (ventilation) at home to help prevent the disease from spreading to other people, if possible. Do not share personal household items, like cups, towels, and utensils. Practice everyday hygiene and cleaning. General instructions Talk to your health care provider if you have a weakened body defense system (immune system). People with a weakened immune system may have a reduced immune response to vaccines. You may need to follow current prevention measures, including wearing a well-fitting mask, avoiding crowds, and avoiding poorly ventilated indoor places. Monitor symptoms and follow health care provider instructions, which may include resting, drinking fluids, and taking medicines. Follow specific isolation and quarantine recommendations if you are in places that can lead to disease outbreaks, such as correctional and detention facilities, homeless shelters, and cruise ships. Return to your normal activities as told by your health care provider. Ask your health care provider what activities are safe for you. Keep all follow-up visits. This is important. Where to find more information CDC: www.cdc.gov/quarantine/index.html Contact   a health care provider if: You have a fever. You have signs and symptoms that  return or get worse after isolation. Get help right away if: You have difficulty breathing. You have chest pain. These symptoms may be an emergency. Get help right away. Call 911. Do not wait to see if the symptoms will go away. Do not drive yourself to the hospital. Summary Isolation and quarantine help protect the public by preventing exposure to people who have or may have a contagious disease. Isolate when you are sick or when you test positive, even if you do not have symptoms. Quarantine and stay away from others when you have been in close contact with someone who has tested positive for a contagious disease. This information is not intended to replace advice given to you by your health care provider. Make sure you discuss any questions you have with your health care provider. Document Revised: 02/10/2021 Document Reviewed: 01/20/2021 Elsevier Patient Education  [place] [facility], or coronavirus disease 2019, is an infection that is caused by a new (novel) coronavirus called SARS-CoV-2. COVID-19 can cause many symptoms. In some people, the virus may not cause any symptoms. In others, it may cause mild or severe symptoms. Some people with severe infection develop severe disease. What are the causes? This illness is caused by a virus. The virus may be in the air as tiny specks of fluid (aerosols) or droplets, or it may be on surfaces. You may catch the virus by: Breathing in droplets from an infected person. Droplets can be spread by a person breathing, speaking, singing, coughing, or sneezing. Touching something, like a table or a doorknob, that has virus on it (is contaminated) and then touching your mouth, nose, or eyes. What increases the risk? Risk for infection: You are more likely to get infected with the COVID-19 virus if: You are within 6 ft (1.8 m) of a person with COVID-19 for 15 minutes or longer. You are providing care for a person who is infected with  COVID-19. You are in close personal contact with other people. Close personal contact includes hugging, kissing, or sharing eating or drinking utensils. Risk for serious illness caused by COVID-19: You are more likely to get seriously ill from the COVID-19 virus if: You have cancer. You have a long-term (chronic) disease, such as: Chronic lung disease. This includes pulmonary embolism, chronic obstructive pulmonary disease, and cystic fibrosis. Long-term disease that lowers your body's ability to fight infection (immunocompromise). Serious cardiac conditions, such as heart failure, coronary artery disease, or cardiomyopathy. Diabetes. Chronic kidney disease. Liver diseases. These include cirrhosis, nonalcoholic fatty liver disease, alcoholic liver disease, or autoimmune hepatitis. You have obesity. You are pregnant or were recently pregnant. You have sickle cell disease. What are the signs or symptoms? Symptoms of this condition can range from mild to severe. Symptoms may appear any time from 2 to 14 days after being exposed to the virus. They include: Fever or chills. Shortness of breath or trouble breathing. Feeling tired or very tired. Headaches, body aches, or muscle aches. Runny or stuffy nose, sneezing, coughing, or sore throat. New loss of taste or smell. This is rare. Some people may also have stomach problems, such as nausea, vomiting, or diarrhea. Other people may not have any symptoms of COVID-19. How is this diagnosed? This condition may be diagnosed by testing samples to check for the COVID-19 virus. The most common tests are the PCR test and the antigen test. Tests may be done  in the lab or at home. They include: Using a swab to take a sample of fluid from the back of your nose and throat (nasopharyngeal fluid), from your nose, or from your throat. Testing a sample of saliva from your mouth. Testing a sample of coughed-up mucus from your lungs (sputum). How is this  treated? Treatment for COVID-19 infection depends on the severity of the condition. Mild symptoms can be managed at home with rest, fluids, and over-the-counter medicines. Serious symptoms may be treated in a hospital intensive care unit (ICU). Treatment in the ICU may include: Supplemental oxygen. Extra oxygen is given through a tube in the nose, a face mask, or a hood. Medicines. These may include: Antivirals, such as monoclonal antibodies. These help your body fight off certain viruses that can cause disease. Anti-inflammatories, such as corticosteroids. These reduce inflammation and suppress the immune system. Antithrombotics. These prevent or treat blood clots, if they develop. Convalescent plasma. This helps boost your immune system, if you have an underlying immunosuppressive condition or are getting immunosuppressive treatments. Prone positioning. This means you will lie on your stomach. This helps oxygen to get into your lungs. Infection control measures. If you are at risk for more serious illness caused by COVID-19, your health care provider may prescribe two long-acting monoclonal antibodies, given together every 6 months. How is this prevented? To protect yourself: Use preventive medicine (pre-exposure prophylaxis). You may get pre-exposure prophylaxis if you have moderate or severe immunocompromise. Get vaccinated. Anyone 30 months old or older who meets guidelines can get a COVID-19 vaccine or vaccine series. This includes people who are pregnant or making breast milk (lactating). Get an added dose of COVID-19 vaccine after your first vaccine or vaccine series if you have moderate to severe immunocompromise. This applies if you have had a solid organ transplant or have been diagnosed with an immunocompromising condition. You should get the added dose 4 weeks after you got the first COVID-19 vaccine or vaccine series. If you get an mRNA vaccine, you will need a 3-dose primary  series. If you get the J&J/Janssen vaccine, you will need a 2-dose primary series, with the second dose being an mRNA vaccine. Talk to your health care provider about getting experimental monoclonal antibodies. This treatment is approved under emergency use authorization to prevent severe illness before or after being exposed to the COVID-19 virus. You may be given monoclonal antibodies if: You have moderate or severe immunocompromise. This includes treatments that lower your immune response. People with immunocompromise may not develop protection against COVID-19 when they are vaccinated. You cannot be vaccinated. You may not get a vaccine if you have a severe allergic reaction to the vaccine or its components. You are not fully vaccinated. You are in a facility where COVID-19 is present and: Are in close contact with a person who is infected with the COVID-19 virus. Are at high risk of being exposed to the COVID-19 virus. You are at risk of illness from new variants of the COVID-19 virus. To protect others: If you have symptoms of COVID-19, take steps to prevent the virus from spreading to others. Stay home. Leave your house only to get medical care. Do not use public transit, if possible. Do not travel while you are sick. Wash your hands often with soap and water for at least 20 seconds. If soap and water are not available, use alcohol-based hand sanitizer. Make sure that all people in your household wash their hands well and often. Cough or sneeze  into a tissue or your sleeve or elbow. Do not cough or sneeze into your hand or into the air. Where to find more information Centers for Disease Control and Prevention: CharmCourses.be World Health Organization: https://www.castaneda.info/ Get help right away if: You have trouble breathing. You have pain or pressure in your chest. You are confused. You have bluish lips and fingernails. You have trouble waking from sleep. You  have symptoms that get worse. These symptoms may be an emergency. Get help right away. Call 911. Do not wait to see if the symptoms will go away. Do not drive yourself to the hospital. Summary COVID-19 is an infection that is caused by a new coronavirus. Sometimes, there are no symptoms. Other times, symptoms range from mild to severe. Some people with a severe COVID-19 infection develop severe disease. The virus that causes COVID-19 can spread from person to person through droplets or aerosols from breathing, speaking, singing, coughing, or sneezing. Mild symptoms of COVID-19 can be managed at home with rest, fluids, and over-the-counter medicines. This information is not intended to replace advice given to you by your health care provider. Make sure you discuss any questions you have with your health care provider. Document Revised: 01/18/2021 Document Reviewed: 01/20/2021 Elsevier Patient Education  [place].

## 2022-02-05 NOTE — Progress Notes (Signed)
Virtual Visit Consent   Riker Collier, you are scheduled for a virtual visit with a [facility] provider today. Just as with appointments in the office, your consent must be obtained to participate. Your consent will be active for this visit and any virtual visit you may have with one of our providers in the next 365 days. If you have a MyChart account, a copy of this consent can be sent to you electronically.  As this is a virtual visit, video technology does not allow for your provider to perform a traditional examination. This may limit your provider's ability to fully assess your condition. If your provider identifies any concerns that need to be evaluated in person or the need to arrange testing (such as labs, EKG, etc.), we will make arrangements to do so. Although advances in technology are sophisticated, we cannot ensure that it will always work on either your end or our end. If the connection with a video visit is poor, the visit may have to be switched to a telephone visit. With either a video or telephone visit, we are not always able to ensure that we have a secure connection.  By engaging in this virtual visit, you consent to the provision of healthcare and authorize for your insurance to be billed (if applicable) for the services provided during this visit. Depending on your insurance coverage, you may receive a charge related to this service.  I need to obtain your verbal consent now. Are you willing to proceed with your visit today? Bradley Lawson has provided verbal consent on 02/05/2022 for a virtual visit (video or telephone). Dellia Nims, FNP  Date: 02/05/2022 11:44 AM  Virtual Visit via Video Note   I, Dellia Nims, connected with  Bradley Lawson  ([id number], Mar 27, 1980) on 02/05/22 at 11:45 AM EST by a video-enabled telemedicine application and verified that I am speaking with the correct person using two identifiers.  Location: Patient: Virtual Visit Location  Patient: Home Provider: Virtual Visit Location Provider: Home Office   I discussed the limitations of evaluation and management by telemedicine and the availability of in person appointments. The patient expressed understanding and agreed to proceed.    History of Present Illness: Bradley Lawson is a 41 y.o. who identifies as a male who was assigned male at birth, and is being seen today for covid positive testign yesterday with sx starting Thursday. He has been around his in labs. He has had fever, chills, cough and body aches. He is improving. He denied wheezing and sob.Marland Kitchen  HPI: HPI  Problems:  Patient Active Problem List   Diagnosis Date Noted   Palpitations 11/16/2020   Dyspnea on exertion 11/16/2020   Shoulder pain 11/12/2020   Osteoarthritis 11/12/2020   Migraine headache 11/12/2020   Colon polyps 11/12/2020   Anxiety 11/12/2020   Allergy 11/12/2020   Hyperlipidemia 11/12/2020   Intestinal metaplasia of stomach 10/01/2020   History of esophageal ulcer 06/30/2020   Hx of adenomatous colonic polyps 06/30/2020   Loose bowel movements 06/30/2020   History of Clostridioides difficile infection 12/01/2019   Adenomatous colon polyp 12/01/2019   Abnormal CT of the chest 12/13/2015   Localized primary osteoarthritis of shoulder regions, 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 Left shoulder, Disp: , Rfl:    lidocaine (XYLOCAINE) 2 % solution, Use as directed 15 mLs in the mouth or throat every 6 (six) hours as needed for mouth pain., Disp: 100 mL, Rfl: 0    loratadine (CLARITIN) 10 MG tablet, Take 10 mg by mouth daily. Seasonal allergies, Disp: , Rfl:    LORazepam (ATIVAN) 0.5 MG tablet, 1 tablet q 30-45 minutes prior to flight as needed (Patient taking differently: Take 0.5 mg by mouth See admin instructions. 1 tablet q 30-45 minutes prior to flight as needed), Disp: 20 tablet, Rfl: 1   QULIPTA 60 MG TABS, TAKE 1 TABLET BY MOUTH EVERY DAY, Disp: 30 tablet, Rfl: 5   tobramycin (TOBREX) 0.3 % ophthalmic solution, Place 1 drop into the right eye every 6 (six) hours., Disp: 5 mL, Rfl: 0   Ubrogepant (UBRELVY) 100 MG TABS, Take 1 tablet by mouth as needed (May repeat in 2 hours.  Maximum 2 tablets in 24 hours.)., Disp: 16 tablet, Rfl: 5  Observations/Objective: Patient is well-developed, well-nourished in no acute distress.  Resting comfortably  at home.  Head is normocephalic, atraumatic.  No labored breathing.  Speech is clear and coherent with logical content.  Patient is alert and oriented at baseline.    Assessment and Plan: 1. COVID  Increase fluids, humidifier at night, tylenol or iburpofen, quarantine discussed, MVI with extra Vitd D and Zinc. Urgent care for worsening sx.   Follow Up Instructions: I discussed the assessment and treatment plan with the patient. The patient was provided an opportunity to ask questions and all were answered. The patient agreed with the plan and demonstrated an understanding of the instructions.  A copy of instructions were sent to the patient via MyChart unless otherwise noted below.     The patient was advised to call back or seek an in-person evaluation if the symptoms worsen or if the condition fails to improve as anticipated.  Time:  I spent 10 minutes with the patient via telehealth technology discussing the above problems/concerns.    Dellia Nims, FNP

## 2022-02-13 ENCOUNTER — Other Ambulatory Visit: Payer: Self-pay | Admitting: Neurology

## 2022-03-08 NOTE — Progress Notes (Unsigned)
NEUROLOGY FOLLOW UP OFFICE NOTE  Benjaman Artman [id number]  Assessment/Plan:   Migraine without aura, without status migrainosus, not intractable   Migraine prevention:  Qulipta '60mg'$  daily *** Migraine rescue:  Ubrelvy '100mg'$  *** Limit use of pain relievers to no more than 2 days out of week to prevent risk of rebound or medication-overuse headache. Keep headache diary Follow up ***     Subjective:  Demetrick Eichenberger is a 42 year old right-handed male who follows up for headaches.   UPDATE: Intensity:  mild Duration:  1 hour with ibuprofen Frequency:  once a week or less  Current NSAIDS/analgesics:  none Current triptans:  none Current ergotamine:  none Current anti-emetic:  none Current muscle relaxants:  none Current Antihypertensive medications:  none Current Antidepressant medications:  none Current Anticonvulsant medications:  none Current anti-CGRP:  Qulipta '60mg'$  QD, Ubrelvy '100mg'$  (not needed for a month) Current Vitamins/Herbal/Supplements:  none Current Antihistamines/Decongestants:  Claritin Other therapy:  none Hormone/birth control:  none Other medications:  Ambien   Caffeine:  2 cups of coffee daily.  1 can Coke Zero daily Diet:  36 oz water daily.  Does not skip meals Exercise:  Routine.  Lifts weights Depression:  no; Anxiety:  A little bit Other pain:  no Sleep:  good   CMP from September was normal.     HISTORY:  He reports history of migraines since childhood.  He has been on propranolol for many years but recently doesn't think it has been effective.  They are moderate bifrontal pressure ache.  Some photophobia and phonophobia.  No nausea, vomiting, visual disturbance, numbness and weakness. They last 3-4 hours.  They occur every other day.  Takes ibuprofen 2 to 3 tablets almost daily.  Triggers include noise, certain smells (perfumes, flowers), stress and sometimes exertion (lifting weights).  Applying pressure on the temples help relieve it.      Previous imaging: 01/23/2013 MRI BRAIN W WO:  Normal study 06/11/2017 MRI BRAIN/PITUITARY W WO:  Normal MRI of the brain and pituitary   Past NSAIDS/analgesics:  Tylenol, Excedrin, ibuprofen Past abortive triptans:  Sumatriptan, Zomig-MLT Past abortive ergotamine:  none Past muscle relaxants:  none Past anti-emetic:  none Past antihypertensive medications:  propranolol ER '160mg'$  Past antidepressant medications:  none Past anticonvulsant medications:  topiramate (worsened tinnitus), zonisamide Past anti-CGRP:  none Past vitamins/Herbal/Supplements:  none Past antihistamines/decongestants:  Flonase Other past therapies:  none     Family history of headache:  Father, daughter    PAST MEDICAL HISTORY: Past Medical History:  Diagnosis Date   Abnormal CT of the chest 12/13/2015   Formatting of this note might be different from the original. 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   Allergy    seasonal   Anxiety    Chronic pain of both shoulders 11/23/2014   Colon polyps    Hyperlipidemia 11/12/2020   Migraine headache    Osteoarthritis    shoulder   Palpitation    cleared by cardiologist as benign   Shoulder pain    left    MEDICATIONS: Current Outpatient Medications on File Prior to Visit  Medication Sig Dispense Refill   Atogepant (QULIPTA) 60 MG TABS TAKE 1 TABLET BY MOUTH EVERY DAY 30 tablet 0   clindamycin (CLEOCIN T) 1 % external solution Apply 1 application topically 2 (two) times daily.     diclofenac Sodium (VOLTAREN ARTHRITIS PAIN) 1 % GEL Apply topically 4 (four) times daily. Left  shoulder     lidocaine (XYLOCAINE) 2 % solution Use as directed 15 mLs in the mouth or throat every 6 (six) hours as needed for mouth pain. 100 mL 0   loratadine (CLARITIN) 10 MG tablet Take 10 mg by mouth daily. Seasonal allergies     LORazepam (ATIVAN) 0.5 MG tablet 1 tablet q 30-45 minutes prior to flight as needed  (Patient taking differently: Take 0.5 mg by mouth See admin instructions. 1 tablet q 30-45 minutes prior to flight as needed) 20 tablet 1   tobramycin (TOBREX) 0.3 % ophthalmic solution Place 1 drop into the right eye every 6 (six) hours. 5 mL 0   Ubrogepant (UBRELVY) 100 MG TABS Take 1 tablet by mouth as needed (May repeat in 2 hours.  Maximum 2 tablets in 24 hours.). 16 tablet 5   No current facility-administered medications on file prior to visit.    ALLERGIES: Allergies  Allergen Reactions   Clindamycin/Lincomycin    Sulfa Antibiotics     FAMILY HISTORY: Family History  Problem Relation Age of Onset   Colon polyps Mother    Irritable bowel syndrome Mother    Anemia Mother    Diverticulosis Father    Colon polyps Father    Irritable bowel syndrome Brother    Colon polyps Maternal Grandmother    Heart disease Maternal Grandfather    Heart attack Maternal Grandfather    Heart disease Paternal Grandmother        CAD   Stomach cancer Paternal Grandfather    Pancreatic cancer Neg Hx    Esophageal cancer Neg Hx    Liver disease Neg Hx    Colon cancer Neg Hx    Inflammatory bowel disease Neg Hx    Rectal cancer Neg Hx       Objective:  *** General: No acute distress.  Patient appears well-groomed.   Head:  Normocephalic/atraumatic Eyes:  Fundi examined but not visualized Neck: supple, no paraspinal tenderness, full range of motion Heart:  Regular rate and rhythm Lungs:  Clear to auscultation bilaterally Back: No paraspinal tenderness Neurological Exam: alert and oriented to person, place, and time.  Speech fluent and not dysarthric, language intact.  CN II-XII intact. Bulk and tone normal, muscle strength 5/5 throughout.  Sensation to light touch intact.  Deep tendon reflexes 2+ throughout.  Finger to nose testing intact.  Gait normal, Romberg negative.   Metta Clines, DO  CC: Marrian Salvage, [place]

## 2022-03-09 ENCOUNTER — Ambulatory Visit (INDEPENDENT_AMBULATORY_CARE_PROVIDER_SITE_OTHER): Payer: Managed Care, Other (non HMO) | Admitting: Neurology

## 2022-03-09 ENCOUNTER — Encounter: Payer: Self-pay | Admitting: Neurology

## 2022-03-09 VITALS — BP 131/92 | HR 72 | Resp 20 | Ht 70.0 in | Wt 210.0 lb

## 2022-03-09 DIAGNOSIS — G43009 Migraine without aura, not intractable, without status migrainosus: Secondary | ICD-10-CM | POA: Diagnosis not present

## 2022-03-09 MED ORDER — QULIPTA 60 MG PO TABS: 1.0000 | ORAL_TABLET | Freq: Every day | ORAL | 11 refills | Status: DC

## 2022-03-09 MED ORDER — UBRELVY 100 MG PO TABS
1.0000 | ORAL_TABLET | ORAL | 11 refills | Status: DC | PRN
Start: 2022-03-09 — End: 2023-03-12

## 2022-03-09 NOTE — Patient Instructions (Signed)
Qulipta '60mg'$  daily Ubrelvy as needed/directed

## 2022-06-04 IMAGING — DX DG SHOULDER 2+V*L*
3 series · 3 of 3 positions shown · non-contrast
Comparison: None Available.

CLINICAL DATA: Left shoulder pain for a few weeks.

EXAM:
LEFT SHOULDER - 2+ VIEW

[shoulder ap (1 of 2)]
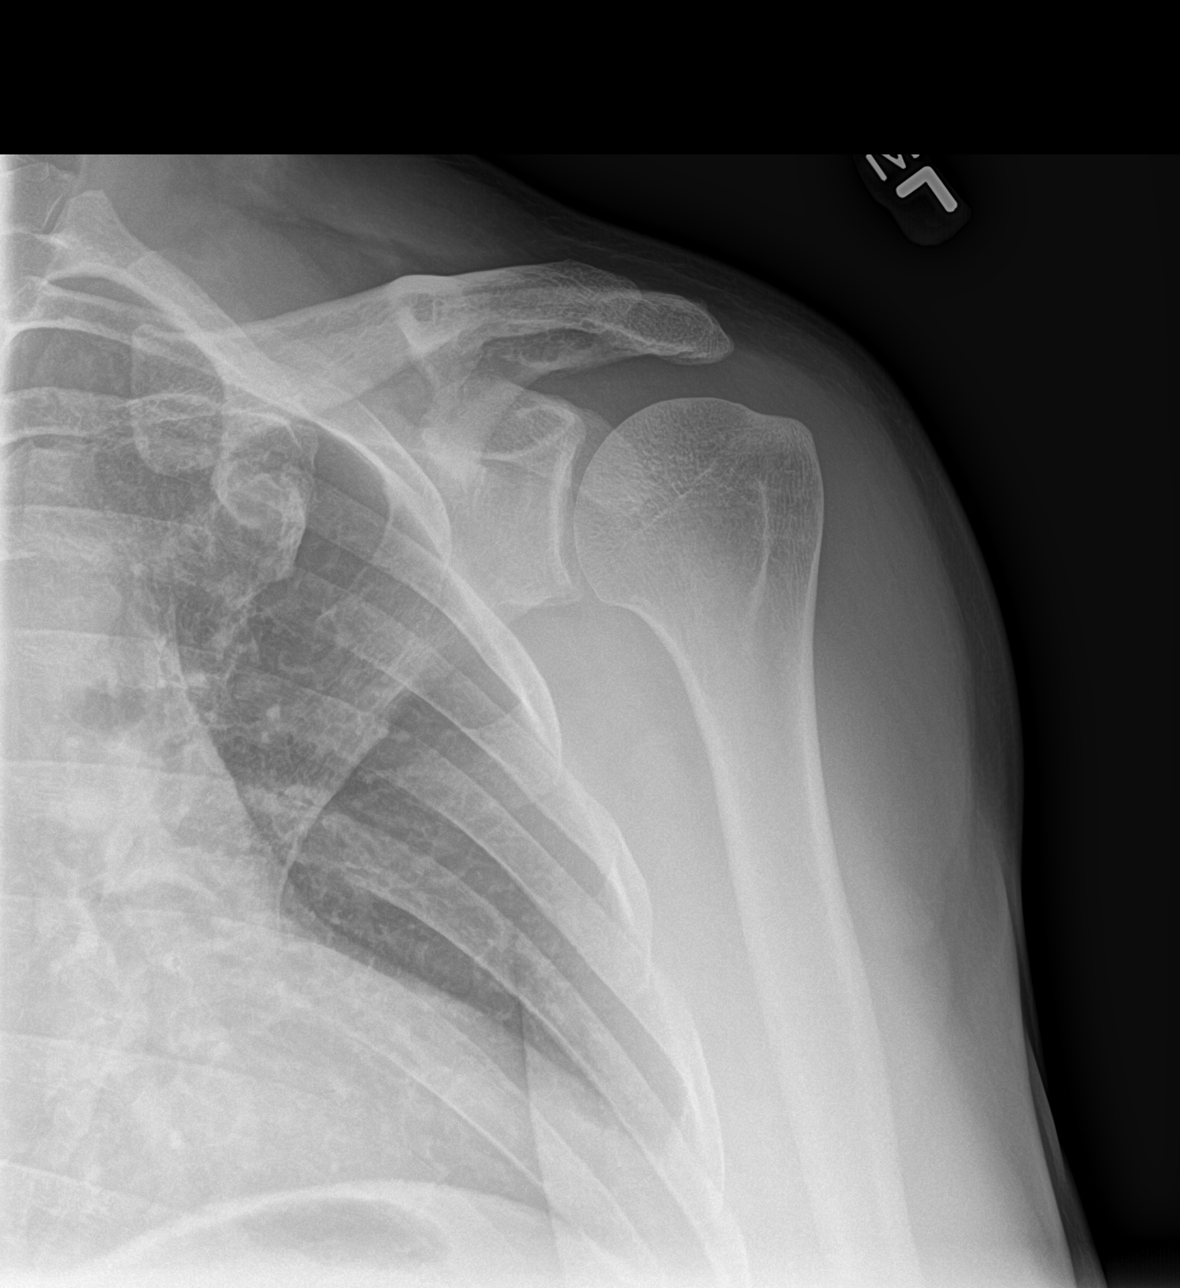

[shoulder ap (2 of 2)]
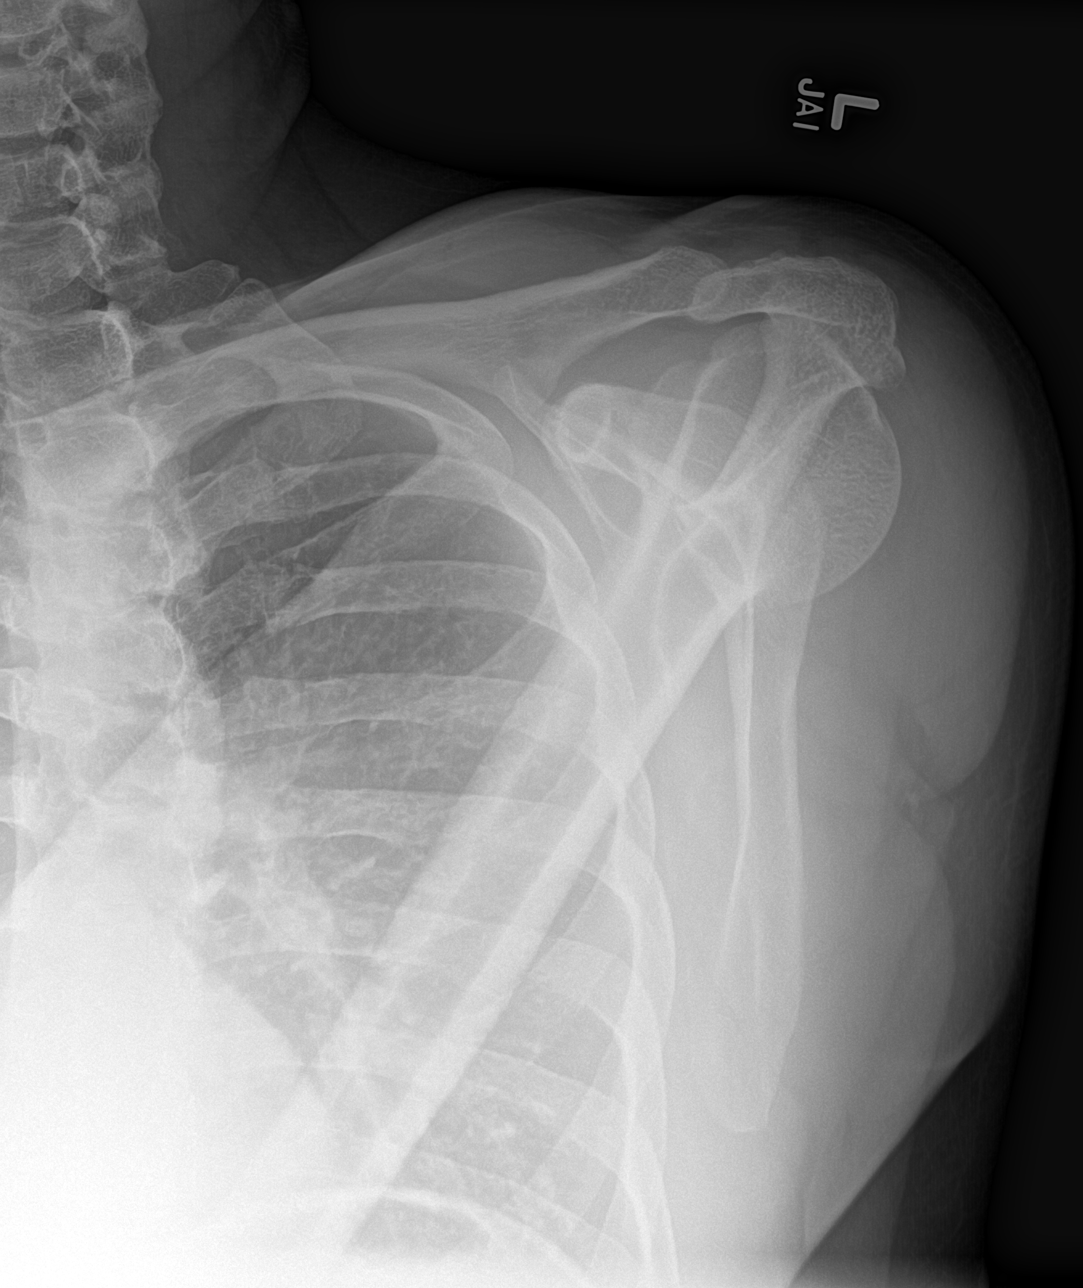

[shoulder axial]
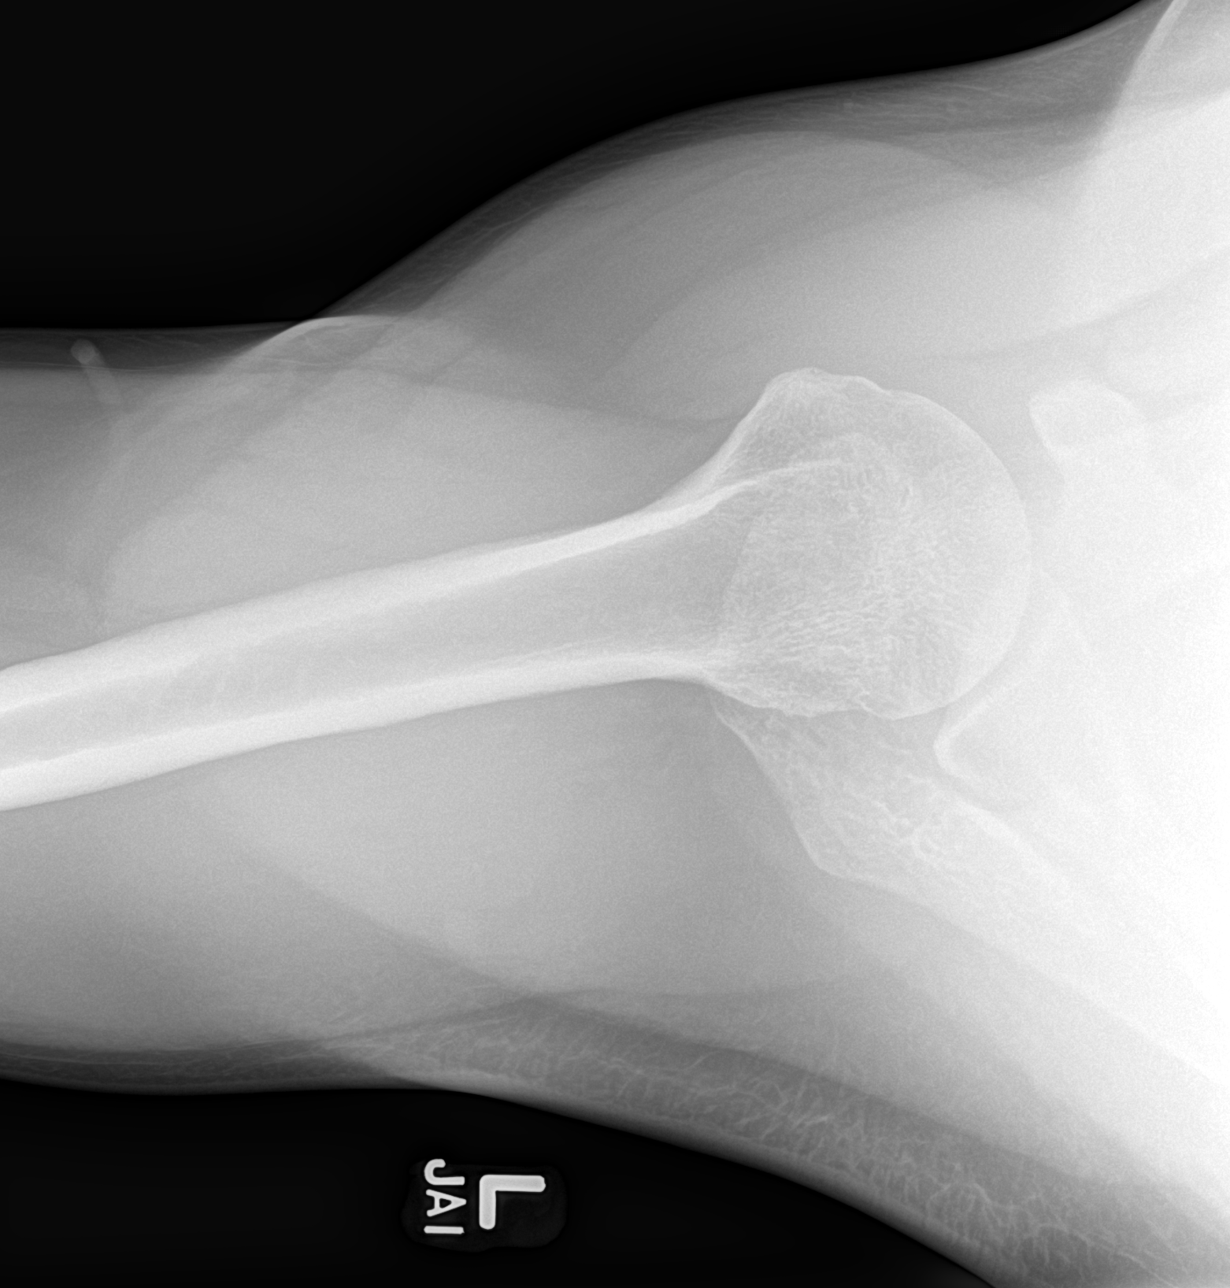

[3 of 3 positions shown; findings below may reference images not displayed]

FINDINGS: Normal bone mineralization. No significant degenerative change
within the glenohumeral or acromioclavicular joint. No acute
fracture or dislocation. The visualized portion of the left lung is
unremarkable.
IMPRESSION: Normal left shoulder radiographs.

## 2022-06-24 ENCOUNTER — Encounter: Payer: Self-pay | Admitting: Family

## 2022-06-30 ENCOUNTER — Encounter: Payer: Self-pay | Admitting: Family

## 2022-06-30 ENCOUNTER — Ambulatory Visit (INDEPENDENT_AMBULATORY_CARE_PROVIDER_SITE_OTHER): Payer: Managed Care, Other (non HMO) | Admitting: Family

## 2022-06-30 VITALS — BP 118/78 | HR 88 | Ht 70.0 in | Wt 208.6 lb

## 2022-06-30 DIAGNOSIS — E559 Vitamin D deficiency, unspecified: Secondary | ICD-10-CM | POA: Diagnosis not present

## 2022-06-30 DIAGNOSIS — R5383 Other fatigue: Secondary | ICD-10-CM | POA: Diagnosis not present

## 2022-06-30 DIAGNOSIS — M791 Myalgia, unspecified site: Secondary | ICD-10-CM | POA: Diagnosis not present

## 2022-06-30 LAB — VITAMIN D 25 HYDROXY (VIT D DEFICIENCY, FRACTURES): VITD: 21.14 ng/mL — ABNORMAL LOW (ref 30.00–100.00)

## 2022-06-30 LAB — VITAMIN B12: Vitamin B-12: 354 pg/mL (ref 211–911)

## 2022-06-30 LAB — CBC WITH DIFFERENTIAL/PLATELET
Basophils Absolute: 0 10*3/uL (ref 0.0–0.1)
Basophils Relative: 0.4 % (ref 0.0–3.0)
Eosinophils Absolute: 0.1 10*3/uL (ref 0.0–0.7)
Eosinophils Relative: 1.2 % (ref 0.0–5.0)
HCT: 45.7 % (ref 39.0–52.0)
Hemoglobin: 15.8 g/dL (ref 13.0–17.0)
Lymphocytes Relative: 31.6 % (ref 12.0–46.0)
Lymphs Abs: 2.6 10*3/uL (ref 0.7–4.0)
MCHC: 34.7 g/dL (ref 30.0–36.0)
MCV: 87.1 fl (ref 78.0–100.0)
Monocytes Absolute: 0.5 10*3/uL (ref 0.1–1.0)
Monocytes Relative: 6.3 % (ref 3.0–12.0)
Neutro Abs: 5 10*3/uL (ref 1.4–7.7)
Neutrophils Relative %: 60.5 % (ref 43.0–77.0)
Platelets: 238 10*3/uL (ref 150.0–400.0)
RBC: 5.24 Mil/uL (ref 4.22–5.81)
RDW: 13.2 % (ref 11.5–15.5)
WBC: 8.3 10*3/uL (ref 4.0–10.5)

## 2022-06-30 LAB — TSH: TSH: 1.37 u[IU]/mL (ref 0.35–5.50)

## 2022-06-30 LAB — COMPREHENSIVE METABOLIC PANEL
ALT: 26 U/L (ref 0–53)
AST: 20 U/L (ref 0–37)
Albumin: 4.4 g/dL (ref 3.5–5.2)
Alkaline Phosphatase: 47 U/L (ref 39–117)
BUN: 17 mg/dL (ref 6–23)
CO2: 30 mEq/L (ref 19–32)
Calcium: 9.4 mg/dL (ref 8.4–10.5)
Chloride: 102 mEq/L (ref 96–112)
Creatinine, Ser: 1.05 mg/dL (ref 0.40–1.50)
GFR: 89.26 mL/min (ref >60.00)
Glucose, Bld: 83 mg/dL (ref 70–99)
Potassium: 4.3 mEq/L (ref 3.5–5.1)
Sodium: 138 mEq/L (ref 135–145)
Total Bilirubin: 0.5 mg/dL (ref 0.2–1.2)
Total Protein: 6.7 g/dL (ref 6.0–8.3)

## 2022-06-30 LAB — MAGNESIUM: Magnesium: 2.1 mg/dL (ref 1.5–2.5)

## 2022-06-30 LAB — HEMOGLOBIN A1C: Hgb A1c MFr Bld: 5.4 % (ref 4.6–6.5)

## 2022-06-30 NOTE — Progress Notes (Signed)
Bradley Lawson is a 42 y.o. male with the following history as recorded in [employer]:  Patient Active Problem List   Diagnosis Date Noted   Palpitations 11/16/2020   Dyspnea on exertion 11/16/2020   Shoulder pain 11/12/2020   Osteoarthritis 11/12/2020   Migraine headache 11/12/2020   Colon polyps 11/12/2020   Anxiety 11/12/2020   Allergy 11/12/2020   Hyperlipidemia 11/12/2020   Intestinal metaplasia of stomach 10/01/2020   History of esophageal ulcer 06/30/2020   Hx of adenomatous colonic polyps 06/30/2020   Loose bowel movements 06/30/2020   History of Clostridioides difficile infection 12/01/2019   Adenomatous colon polyp 12/01/2019   Abnormal CT of the chest 12/13/2015   Localized primary osteoarthritis of shoulder regions, bilateral 11/23/2014   Chronic pain of both shoulders 11/23/2014   Chronic nonintractable headache 11/17/2014    Current Outpatient Medications  Medication Sig Dispense Refill   Atogepant (QULIPTA) 60 MG TABS Take 1 tablet (60 mg total) by mouth daily. 30 tablet 11   loratadine (CLARITIN) 10 MG tablet Take 10 mg by mouth daily. Seasonal allergies     LORazepam (ATIVAN) 0.5 MG tablet 1 tablet q 30-45 minutes prior to flight as needed (Patient taking differently: Take 0.5 mg by mouth See admin instructions. 1 tablet q 30-45 minutes prior to flight as needed) 20 tablet 1   Ubrogepant (UBRELVY) 100 MG TABS Take 1 tablet (100 mg total) by mouth as needed (May repeat in 2 hours.  Maximum 2 tablets in 24 hours.). 16 tablet 11   clindamycin (CLEOCIN T) 1 % external solution Apply 1 application topically 2 (two) times daily. (Patient not taking: Reported on 06/30/2022)     No current facility-administered medications for this visit.    Allergies: Clindamycin/lincomycin and Sulfa antibiotics  Past Medical History:  Diagnosis Date   Abnormal CT of the chest 12/13/2015   Formatting of this note might be different from the original. Wispy soft tissue anterior  mediastinum stable on multiple scans and likely residual thymus tissue   Allergy    seasonal   Anxiety    Chronic pain of both shoulders 11/23/2014   Colon polyps    Hyperlipidemia 11/12/2020   Migraine headache    Osteoarthritis    shoulder   Palpitation    cleared by cardiologist as benign   Shoulder pain    left    Past Surgical History:  Procedure Laterality Date   COLONOSCOPY     UPPER GASTROINTESTINAL ENDOSCOPY      Family History  Problem Relation Age of Onset   Colon polyps Mother    Irritable bowel syndrome Mother    Anemia Mother    Diverticulosis Father    Colon polyps Father    Irritable bowel syndrome Brother    Colon polyps Maternal Grandmother    Heart disease Maternal Grandfather    Heart attack Maternal Grandfather    Heart disease Paternal Grandmother        CAD   Stomach cancer Paternal Grandfather    Pancreatic cancer Neg Hx    Esophageal cancer Neg Hx    Liver disease Neg Hx    Colon cancer Neg Hx    Inflammatory bowel disease Neg Hx    Rectal cancer Neg Hx     Social History   Tobacco Use   Smoking status: Never   Smokeless tobacco: Never  Substance Use Topics   Alcohol use: Yes    Comment: 2-3 on weekends    Subjective:   Patient had  sent message earlier this week with concerns about fatigue; notes that he is actually feeling better today; daughter had similar symptoms earlier this week; No known exposure to ticks; normal appetite; in general, sleeping well;   Objective:  Vitals:   06/30/22 0909  BP: 118/78  Pulse: 88  SpO2: 99%  Weight: 208 lb 9.6 oz (94.6 kg)  Height: 5\' 10"  (1.778 m)    General: Well developed, well nourished, in no acute distress  Skin : Warm and dry.  Head: Normocephalic and atraumatic  Eyes: Sclera and conjunctiva clear; pupils round and reactive to light; extraocular movements intact  Ears: External normal; canals clear; tympanic membranes normal  Oropharynx: Pink, supple. No suspicious lesions  Neck:  Supple without thyromegaly, adenopathy  Lungs: Respirations unlabored; clear to auscultation bilaterally without wheeze, rales, rhonchi  CVS exam: normal rate and regular rhythm.  Musculoskeletal: No deformities; no active joint inflammation  Extremities: No edema, cyanosis, clubbing  Vessels: Symmetric bilaterally  Neurologic: Alert and oriented; speech intact; face symmetrical; moves all extremities well; CNII-XII intact without focal deficit    Assessment:  1. Other fatigue   2. Myalgia     Plan:  Patient is feeling better today- ? Recent viral illnes; however will update labs today; follow up to be determined;   No follow-ups on file.  Orders Placed This Encounter  Procedures   Testosterone Total,Free,Bio, Males-([facility])   CBC with Differential/Platelet   Comp Met (CMET)   TSH   B12   Magnesium   Hemoglobin A1c    Requested Prescriptions    No prescriptions requested or ordered in this encounter

## 2022-07-01 LAB — TESTOSTERONE TOTAL,FREE,BIO, MALES
Albumin: 4.5 g/dL (ref 3.6–5.1)
Sex Hormone Binding: 13 nmol/L (ref 10–50)
Testosterone: 208 ng/dL — ABNORMAL LOW (ref 250–827)

## 2022-07-03 ENCOUNTER — Other Ambulatory Visit: Payer: Self-pay | Admitting: Family

## 2022-07-03 DIAGNOSIS — R7989 Other specified abnormal findings of blood chemistry: Secondary | ICD-10-CM

## 2022-07-03 MED ORDER — VITAMIN D (ERGOCALCIFEROL) 1.25 MG (50000 UNIT) PO CAPS: 50000.0000 [IU] | ORAL_CAPSULE | ORAL | 0 refills | Status: AC

## 2022-07-13 ENCOUNTER — Encounter: Payer: Self-pay | Admitting: Urology

## 2022-07-13 ENCOUNTER — Ambulatory Visit: Payer: Managed Care, Other (non HMO) | Admitting: Urology

## 2022-07-13 VITALS — BP 142/100 | HR 69 | Ht 70.0 in | Wt 205.0 lb

## 2022-07-13 DIAGNOSIS — E291 Testicular hypofunction: Secondary | ICD-10-CM | POA: Diagnosis not present

## 2022-07-13 MED ORDER — CLOMIPHENE CITRATE 50 MG PO TABS: 50.0000 mg | ORAL_TABLET | ORAL | 3 refills | Status: DC

## 2022-07-13 NOTE — Progress Notes (Signed)
Assessment: 1. Hypogonadism in male      Plan: Today I had a long discussion with the patient regarding options for TRT.  He has not interested in ongoing fertility but is very hesitant to commit to long-term exogenous testosterone supplementation.  We discussed the option of Clomid. Following our discussion he would like to begin a trial of Clomid. Rx: Clomid 50 mg every other day Repeat testosterone in 6 weeks  Chief Complaint: low testosterone  History of Present Illness:  Bradley Lawson is a 42 y.o. male who is seen in consultation from Olive Bass, FNP for evaluation of low testosterone. Patient has had low testosterone going back to 2018.  Initial level at that time was 34.  Patient actually had extensive evaluation at that time was found to have normal gonadotropins and other pituitary hormones.  He also underwent an MRI of the brain to assess his pituitary which was normal.  He was started on exogenous testosterone-AndroGel and took that for approximately 6 months or so but noticed no change in his symptoms.  He was very minimally symptomatic at that time.  He is not taking any supplemental testosterone since that time.  Recent total testosterone 208 with low bioavailable testosterone of 145.  Patient now does report some hypogonadal symptoms including lethargy and poor exercise recovery.  He also reports some diminished libido.  No ED problems.   Past Medical History:  Past Medical History:  Diagnosis Date   Abnormal CT of the chest 12/13/2015   Formatting of this note might be different from the original. 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   Allergy    seasonal   Anxiety    Chronic pain of both shoulders 11/23/2014   Colon polyps    Hyperlipidemia 11/12/2020   Migraine headache    Osteoarthritis    shoulder   Palpitation    cleared by cardiologist as benign   Shoulder pain    left     Past Surgical History:  Past Surgical History:  Procedure Laterality Date   COLONOSCOPY     UPPER GASTROINTESTINAL ENDOSCOPY      Allergies:  Allergies  Allergen Reactions   Clindamycin/Lincomycin    Sulfa Antibiotics     Family History:  Family History  Problem Relation Age of Onset   Colon polyps Mother    Irritable bowel syndrome Mother    Anemia Mother    Diverticulosis Father    Colon polyps Father    Irritable bowel syndrome Brother    Colon polyps Maternal Grandmother    Heart disease Maternal Grandfather    Heart attack Maternal Grandfather    Heart disease Paternal Grandmother        CAD   Stomach cancer Paternal Grandfather    Pancreatic cancer Neg Hx    Esophageal cancer Neg Hx    Liver disease Neg Hx    Colon cancer Neg Hx    Inflammatory bowel disease Neg Hx    Rectal cancer Neg Hx     Social History:  Social History   Tobacco Use   Smoking status: Never   Smokeless tobacco: Never  Vaping Use   Vaping Use: Never used  Substance Use Topics   Alcohol use: Yes    Comment: 2-3 on weekends   Drug use: Never    Review of symptoms:  Constitutional:  Negative for unexplained weight loss, night sweats, fever, chills ENT:  Negative for nose bleeds,  sinus pain, painful swallowing CV:  Negative for chest pain, shortness of breath, exercise intolerance, palpitations, loss of consciousness Resp:  Negative for cough, wheezing, shortness of breath GI:  Negative for nausea, vomiting, diarrhea, bloody stools GU:  Positives noted in HPI; otherwise negative for gross hematuria, dysuria, urinary incontinence Neuro:  Negative for seizures, poor balance, limb weakness, slurred speech Psych:  Negative for lack of energy, depression, anxiety Endocrine:  Negative for polydipsia, polyuria, symptoms of hypoglycemia (dizziness, hunger, sweating) Hematologic:  Negative for anemia, purpura, petechia, prolonged or excessive bleeding, use of anticoagulants  Allergic:   Negative for difficulty breathing or choking as a result of exposure to anything; no shellfish allergy; no allergic response (rash/itch) to materials, foods  Physical exam: BP (!) 142/100   Pulse 69   Ht 5\' 10"  (1.778 m)   Wt 205 lb (93 kg)   BMI 29.41 kg/m  GENERAL APPEARANCE:  Well appearing, well developed, well nourished, NAD

## 2022-08-24 ENCOUNTER — Other Ambulatory Visit: Payer: Managed Care, Other (non HMO)

## 2022-08-24 DIAGNOSIS — E291 Testicular hypofunction: Secondary | ICD-10-CM

## 2022-08-25 LAB — SEX HORMONE BINDING GLOBULIN: Sex Hormone Binding: 15.1 nmol/L — ABNORMAL LOW (ref 16.5–55.9)

## 2022-08-25 LAB — TESTOSTERONE: Testosterone: 664 ng/dL (ref 264–916)

## 2022-08-28 ENCOUNTER — Telehealth: Payer: Self-pay | Admitting: Urology

## 2022-08-28 NOTE — Telephone Encounter (Signed)
LVM for pt to call and sch in 6 months per dr hall.

## 2022-09-21 ENCOUNTER — Other Ambulatory Visit: Payer: Self-pay | Admitting: Family

## 2022-11-06 ENCOUNTER — Encounter: Payer: Self-pay | Admitting: Family Medicine

## 2022-11-06 ENCOUNTER — Other Ambulatory Visit: Payer: Self-pay

## 2022-11-06 ENCOUNTER — Ambulatory Visit (INDEPENDENT_AMBULATORY_CARE_PROVIDER_SITE_OTHER): Payer: Managed Care, Other (non HMO) | Admitting: Family Medicine

## 2022-11-06 VITALS — BP 130/84 | HR 84 | Ht 70.0 in | Wt 212.0 lb

## 2022-11-06 DIAGNOSIS — S86112A Strain of other muscle(s) and tendon(s) of posterior muscle group at lower leg level, left leg, initial encounter: Secondary | ICD-10-CM | POA: Diagnosis not present

## 2022-11-06 DIAGNOSIS — M79662 Pain in left lower leg: Secondary | ICD-10-CM

## 2022-11-06 NOTE — Patient Instructions (Addendum)
Thank you for coming in today.  Use compression sleeve  Home Exercises 1-3 x daily  Recheck in 1 month.

## 2022-11-06 NOTE — Progress Notes (Signed)
   I, Bradley Lawson, CMA acting as a scribe for Bradley Graham, MD.  Bradley Lawson is a 42 y.o. male who presents to [facility] at [facility] today for L calf pain. Pt was previously seen in 2021 by Dr. Denyse Amass for a rupture of his R gastroc.   Today, pt c/o L calf pain ongoing for about 2 weeks. MOI: strained while playing pickle ball, felt a crunch while moving forward. Pt locates pain to the lower calf. Denies swelling at time of injury but notes significant bruising. Pain with ambulation and WB.   Treatments tried: ice, compression sleeve intermittently, IBU  Pertinent review of systems: No fevers or chills  Relevant historical information: Migraine headache.  History of contralateral right calf tear.   Exam:  BP 130/84   Pulse 84   Ht 5\' 10"  (1.778 m)   Wt 212 lb (96.2 kg)   SpO2 97%   BMI 30.42 kg/m  General: Well Developed, well nourished, and in no acute distress.   MSK: Left calf some swelling and bruising is present.  Tender palpation medial gastrocnemius. Foot and ankle motion is limited dorsiflexion with pain.  Active plantarflexion limited with pain.   Antalgic gait present. Distal Achilles tendon no palpable defects present.  Mildly tender palpation.    Lab and Radiology Results  Diagnostic Limited MSK Ultrasound of: Left medial gastrocnemius and Achilles tendon Hypoechoic fluid collecting deep to the gastrocnemius at the medial head near the muscle tendinous junction consistent with a hematoma or seroma. Distal Achilles tendon is intact some increased swelling is present increase in the diameter of the Achilles tendon to 0.67 centimeters (normal is 0.4cm).  No definitive tear is present in the Achilles tendon. Impression: Gastrocnemius muscle tear and Achilles tendinitis     Assessment and Plan: 42 y.o. male with left calf pain occurring with an injury about 2 weeks ago consistent with calf muscle tear with now developing hematoma/seroma and  Achilles tendinitis.  He is having trouble walking today.  Recommend calf compression sleeve and eccentric exercises.  Use crutches or a cam walker boot if needed along with a heel lift.  Recheck in 1 month.   PDMP not reviewed this encounter. Orders Placed This Encounter  Procedures   [place] LIMITED JOINT SPACE STRUCTURES LOW LEFT(NO LINKED CHARGES)    Order Specific Question:   Reason for Exam (SYMPTOM  OR DIAGNOSIS REQUIRED)    Answer:   left calf pain    Order Specific Question:   Preferred imaging location?    Answer:   [facility]   No orders of the defined types were placed in this encounter.    Discussed warning signs or symptoms. Please see discharge instructions. Patient expresses understanding.   The above documentation has been reviewed and is accurate and complete Bradley Lawson, M.D.

## 2022-12-08 NOTE — Progress Notes (Unsigned)
   Rubin Payor, PhD, LAT, ATC acting as a scribe for Bradley Graham, MD.  Bradley Lawson is a 42 y.o. male who presents to [facility] at [facility] today for f/u L gatroc rupture. Pt was last seen by Dr. Denyse Amass on 11/06/22 and was advised to use a calf compression sleeve, crutches/CAM walker boot prn. Also taught HEP.  Today, pt reports ***  Pertinent review of systems: ***  Relevant historical information: ***   Exam:  There were no vitals taken for this visit. General: Well Developed, well nourished, and in no acute distress.   MSK: ***    Lab and Radiology Results No results found for this or any previous visit (from the past 72 hour(s)). No results found.     Assessment and Plan: 43 y.o. male with ***   PDMP not reviewed this encounter. No orders of the defined types were placed in this encounter.  No orders of the defined types were placed in this encounter.    Discussed warning signs or symptoms. Please see discharge instructions. Patient expresses understanding.   ***

## 2022-12-11 ENCOUNTER — Encounter: Payer: Self-pay | Admitting: Family Medicine

## 2022-12-11 ENCOUNTER — Ambulatory Visit (INDEPENDENT_AMBULATORY_CARE_PROVIDER_SITE_OTHER): Payer: Managed Care, Other (non HMO) | Admitting: Family Medicine

## 2022-12-11 ENCOUNTER — Other Ambulatory Visit: Payer: Self-pay

## 2022-12-11 VITALS — BP 112/82 | HR 78 | Ht 70.0 in | Wt 214.0 lb

## 2022-12-11 DIAGNOSIS — M79662 Pain in left lower leg: Secondary | ICD-10-CM

## 2022-12-11 NOTE — Patient Instructions (Signed)
Thank you for coming in today.   Recheck as needed.   Continue to progress as tolerated.

## 2023-01-02 ENCOUNTER — Ambulatory Visit: Payer: Managed Care, Other (non HMO) | Admitting: Urology

## 2023-01-14 ENCOUNTER — Encounter: Payer: Self-pay | Admitting: Family

## 2023-01-16 ENCOUNTER — Other Ambulatory Visit: Payer: Self-pay | Admitting: Family

## 2023-01-16 ENCOUNTER — Encounter: Payer: Self-pay | Admitting: Urology

## 2023-01-16 ENCOUNTER — Ambulatory Visit (INDEPENDENT_AMBULATORY_CARE_PROVIDER_SITE_OTHER): Payer: Managed Care, Other (non HMO) | Admitting: Urology

## 2023-01-16 VITALS — BP 143/107 | HR 80 | Ht 70.0 in | Wt 205.0 lb

## 2023-01-16 DIAGNOSIS — E291 Testicular hypofunction: Secondary | ICD-10-CM | POA: Diagnosis not present

## 2023-01-16 MED ORDER — LORAZEPAM 0.5 MG PO TABS: ORAL_TABLET | ORAL | 0 refills | Status: DC

## 2023-01-16 MED ORDER — CLOMIPHENE CITRATE 50 MG PO TABS: 50.0000 mg | ORAL_TABLET | ORAL | 3 refills | Status: DC

## 2023-01-16 NOTE — Progress Notes (Signed)
   Assessment: 1. Hypogonadism in male     Plan: Continue Clomid 50 mg every other day Will repeat testosterone and SHBG Follow-up 6 months for recheck  Chief Complaint: Chief Complaint  Patient presents with   Hypogonadism    HPI: Bradley Lawson is a 42 y.o. male who presents for continued evaluation of hypogonadism. Patient is currently taking Clomid 50 mg every other day.  His follow-up levels last July were 664.  He reports improvement in his hypogonadal symptoms. He would like to continue therapy.  Please see my note 07/13/2022 at the time of initial visit for detailed history and exam. In summary-- Patient has had low testosterone going back to 2018.  Initial level at that time was 34.  Patient actually had extensive evaluation at that time was found to have normal gonadotropins and other pituitary hormones.  He also underwent an MRI of the brain to assess his pituitary which was normal.  He was started on exogenous testosterone-AndroGel and took that for approximately 6 months or so but noticed no change in his symptoms.  He was very minimally symptomatic at that time.  He is not taking any supplemental testosterone since that time.   Recent total testosterone 208 with low bioavailable testosterone of 145.  Patient now does report some hypogonadal symptoms including lethargy and poor exercise recovery.  He also reports some diminished libido.  No ED problems. Following our discussion of options at that time the patient elected to begin Clomid 50 mg every other day (although he stated that he was was not interested in further fertility). Follow-up testosterone 08/2022 = 664  Portions of the above documentation were copied from a prior visit for review purposes only.  Allergies: Allergies  Allergen Reactions   Clindamycin/Lincomycin    Sulfa Antibiotics     PMH: Past Medical History:  Diagnosis Date   Abnormal CT of the chest 12/13/2015   Formatting of this note might  be different from the original. 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   Allergy    seasonal   Anxiety    Chronic pain of both shoulders 11/23/2014   Colon polyps    Hyperlipidemia 11/12/2020   Migraine headache    Osteoarthritis    shoulder   Palpitation    cleared by cardiologist as benign   Shoulder pain    left    PSH: Past Surgical History:  Procedure Laterality Date   COLONOSCOPY     UPPER GASTROINTESTINAL ENDOSCOPY      SH: Social History   Tobacco Use   Smoking status: Never   Smokeless tobacco: Never  Vaping Use   Vaping status: Never Used  Substance Use Topics   Alcohol use: Yes    Comment: 2-3 on weekends   Drug use: Never    ROS: Constitutional:  Negative for fever, chills, weight loss CV: Negative for chest pain, previous MI, hypertension Respiratory:  Negative for shortness of breath, wheezing, sleep apnea, frequent cough GI:  Negative for nausea, vomiting, bloody stool, GERD  PE: BP (!) 143/107   Pulse 80   Ht 5\' 10"  (1.778 m)   Wt 205 lb (93 kg)   BMI 29.41 kg/m  GENERAL APPEARANCE:  Well appearing, well developed, well nourished, NAD

## 2023-03-01 ENCOUNTER — Telehealth: Payer: Managed Care, Other (non HMO) | Admitting: Family

## 2023-03-01 ENCOUNTER — Encounter: Payer: Self-pay | Admitting: Family

## 2023-03-01 VITALS — Ht 70.0 in | Wt 205.0 lb

## 2023-03-01 DIAGNOSIS — Z20828 Contact with and (suspected) exposure to other viral communicable diseases: Secondary | ICD-10-CM | POA: Diagnosis not present

## 2023-03-01 MED ORDER — OSELTAMIVIR PHOSPHATE 75 MG PO CAPS: 75.0000 mg | ORAL_CAPSULE | Freq: Every day | ORAL | 0 refills | Status: AC

## 2023-03-01 NOTE — Progress Notes (Signed)
Bradley Lawson is a 43 y.o. male with the following history as recorded in EpicCare:  Patient Active Problem List   Diagnosis Date Noted   Palpitations 11/16/2020   Dyspnea on exertion 11/16/2020   Shoulder pain 11/12/2020   Osteoarthritis 11/12/2020   Migraine headache 11/12/2020   Colon polyps 11/12/2020   Anxiety 11/12/2020   Allergy 11/12/2020   Hyperlipidemia 11/12/2020   Intestinal metaplasia of stomach 10/01/2020   History of esophageal ulcer 06/30/2020   Hx of adenomatous colonic polyps 06/30/2020   Loose bowel movements 06/30/2020   History of Clostridioides difficile infection 12/01/2019   Adenomatous colon polyp 12/01/2019   Abnormal CT of the chest 12/13/2015   Localized primary osteoarthritis of shoulder regions, bilateral 11/23/2014   Chronic pain of both shoulders 11/23/2014   Chronic nonintractable headache 11/17/2014    Current Outpatient Medications  Medication Sig Dispense Refill   Atogepant (QULIPTA) 60 MG TABS Take 1 tablet (60 mg total) by mouth daily. 30 tablet 11   clindamycin (CLEOCIN T) 1 % external solution Apply 1 application  topically 2 (two) times daily.     clomiPHENE (CLOMID) 50 MG tablet Take 1 tablet (50 mg total) by mouth every other day. 45 tablet 3   loratadine (CLARITIN) 10 MG tablet Take 10 mg by mouth daily. Seasonal allergies     LORazepam (ATIVAN) 0.5 MG tablet 1 tablet q 30-45 minutes prior to flight as needed 20 tablet 0   oseltamivir (TAMIFLU) 75 MG capsule Take 1 capsule (75 mg total) by mouth daily for 10 days. 10 capsule 0   Ubrogepant (UBRELVY) 100 MG TABS Take 1 tablet (100 mg total) by mouth as needed (May repeat in 2 hours.  Maximum 2 tablets in 24 hours.). 16 tablet 11   No current facility-administered medications for this visit.    Allergies: Clindamycin/lincomycin and Sulfa antibiotics  Past Medical History:  Diagnosis Date   Abnormal CT of the chest 12/13/2015   Formatting of this note might be different from the  original. 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   Allergy    seasonal   Anxiety    Chronic pain of both shoulders 11/23/2014   Colon polyps    Hyperlipidemia 11/12/2020   Migraine headache    Osteoarthritis    shoulder   Palpitation    cleared by cardiologist as benign   Shoulder pain    left    Past Surgical History:  Procedure Laterality Date   COLONOSCOPY     UPPER GASTROINTESTINAL ENDOSCOPY      Family History  Problem Relation Age of Onset   Colon polyps Mother    Irritable bowel syndrome Mother    Anemia Mother    Diverticulosis Father    Colon polyps Father    Irritable bowel syndrome Brother    Colon polyps Maternal Grandmother    Heart disease Maternal Grandfather    Heart attack Maternal Grandfather    Heart disease Paternal Grandmother        CAD   Stomach cancer Paternal Grandfather    Pancreatic cancer Neg Hx    Esophageal cancer Neg Hx    Liver disease Neg Hx    Colon cancer Neg Hx    Inflammatory bowel disease Neg Hx    Rectal cancer Neg Hx     Social History   Tobacco Use   Smoking status: Never   Smokeless tobacco: Never  Substance Use Topics   Alcohol  use: Yes    Comment: 2-3 on weekends    Subjective:    I connected with Bradley Lawson on 03/01/23 at 10:40 AM EST by a video enabled telemedicine application and verified that I am speaking with the correct person using two identifiers.   I discussed the limitations of evaluation and management by telemedicine and the availability of in person appointments. The patient expressed understanding and agreed to proceed. Provider in office/ patient is at home; provider and patient are only 2 people on video call.   Notes that he was sick approximately 3 weeks ago for 5 days and did feel like he improved; unfortunately, his family has been very sick with flu; her wife tested positive on 1/4 and son 1/8; patient became concerned when that  he started to feel a tickle in back of his throat in the past 1-2 days; he denies any fever, body aches; notes he just wants to do anything he can to try and keep from starting another round of illness in home;    Objective:  Vitals:   03/01/23 1039  Weight: 205 lb (93 kg)  Height: 5\' 10"  (1.778 m)    General: Well developed, well nourished, in no acute distress  Skin : Warm and dry.  Head: Normocephalic and atraumatic  Lungs: Respirations unlabored;  Neurologic: Alert and oriented; speech intact; face symmetrical; moves all extremities well; CNII-XII intact without focal deficit   Assessment:  1. Exposure to the flu     Plan:  Will go ahead and start prophylactic treatment for flu with Tamiflu 75 mg every day x 10 days; he can use OTC cough/ cold medications as needed for the drainage he has been experiencing in past day; increase fluids, rest and follow up if symptoms worsen, do not resolve.   No follow-ups on file.  No orders of the defined types were placed in this encounter.   Requested Prescriptions   Signed Prescriptions Disp Refills   oseltamivir (TAMIFLU) 75 MG capsule 10 capsule 0    Sig: Take 1 capsule (75 mg total) by mouth daily for 10 days.

## 2023-03-09 NOTE — Progress Notes (Unsigned)
NEUROLOGY FOLLOW UP OFFICE NOTE  Bradley Lawson [id number]  Assessment/Plan:   Migraine without aura, without status migrainosus, not intractable Tinnitus  Migraine prevention:  Bradley Lawson 60mg  daily  Migraine rescue:  Bradley Lawson 100mg   Limit use of pain relievers to no more than 2 days out of week to prevent risk of rebound or medication-overuse headache. Keep headache diary May want to consider getting hearing test. Follow up one year      Subjective:  Bradley Lawson is a 43 year old right-handed male who follows up for headaches.   UPDATE: Intensity:  mild Duration:  1 hour with ibuprofen or Bradley Lawson Frequency:  once every one to two weeks. Usually occurs due to triggers.    Tinnitus has been worse over the past year.  No worsening of hearing.  He did start playing guitar over the past year.    Current NSAIDS/analgesics:  none Current triptans:  none Current ergotamine:  none Current anti-emetic:  none Current muscle relaxants:  none Current Antihypertensive medications:  none Current Antidepressant medications:  none Current Anticonvulsant medications:  none Current anti-CGRP:  Bradley Lawson 60mg  QD, Bradley Lawson 100mg  (not needed for a month) Current Vitamins/Herbal/Supplements:  none Current Antihistamines/Decongestants:  Bradley Lawson Other therapy:  none Hormone/birth control:  none Other medications:  Ambien   Caffeine:  2 cups of coffee daily.  1 can Coke Zero daily Diet:  36 oz water daily.  Does not skip meals Exercise:  Routine.  Lifts weights Depression:  no; Anxiety:  A little bit Other pain:  no Sleep:  good   CMP from September was normal.     HISTORY:  He reports history of migraines since childhood.  He has been on propranolol for many years but recently doesn't think it has been effective.  They are moderate bifrontal pressure ache.  Some photophobia and phonophobia.  No nausea, vomiting, visual disturbance, numbness and weakness. They last 3-4 hours.  They  occur every other day.  Takes ibuprofen 2 to 3 tablets almost daily.  Triggers include noise, certain smells (perfumes, flowers), stress and sometimes exertion (lifting weights).  Applying pressure on the temples help relieve it.     Previous imaging: 01/23/2013 MRI BRAIN W WO:  Normal study 06/11/2017 MRI BRAIN/PITUITARY W WO:  Normal MRI of the brain and pituitary   Past NSAIDS/analgesics:  Tylenol, Excedrin, ibuprofen Past abortive triptans:  Bradley Lawson, Bradley Lawson Past abortive ergotamine:  none Past muscle relaxants:  none Past anti-emetic:  none Past antihypertensive medications:  propranolol ER 160mg  Past antidepressant medications:  none Past anticonvulsant medications:  topiramate (worsened tinnitus), zonisamide Past anti-CGRP:  none Past vitamins/Herbal/Supplements:  none Past antihistamines/decongestants:  Bradley Lawson Other past therapies:  none     Family history of headache:  Father, daughter    PAST MEDICAL HISTORY: Past Medical History:  Diagnosis Date   Abnormal CT of the chest 12/13/2015   Formatting of this note might be different from the original. 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 30 tablet 11   clindamycin (Bradley Lawson T) 1 % external solution Apply 1 application  topically 2 (two) times daily.     Bradley Lawson (Bradley Lawson) 50 MG tablet Take 1 tablet (50 mg total) by mouth every other day. 45 tablet 3   Bradley Lawson (Bradley Lawson) 10 MG tablet Take 10 mg by mouth daily. Seasonal  allergies     Bradley Lawson (Bradley Lawson) 0.5 MG tablet 1 tablet q 30-45 minutes prior to flight as needed 20 tablet 0   Bradley Lawson (Bradley Lawson) 75 MG capsule Take 1 capsule (75 mg total) by mouth daily for 10 days. 10 capsule 0   Bradley Lawson (Bradley Lawson) 100 MG TABS Take 1 tablet (100 mg total) by mouth as needed (May repeat in 2 hours.  Maximum 2 tablets in 24 hours.). 16 tablet 11   No current facility-administered medications on file prior to visit.     ALLERGIES: Allergies  Allergen Reactions   Clindamycin/Lincomycin    Sulfa Antibiotics     FAMILY HISTORY: Family History  Problem Relation Age of Onset   Colon polyps Mother    Irritable bowel syndrome Mother    Anemia Mother    Diverticulosis Father    Colon polyps Father    Irritable bowel syndrome Brother    Colon polyps Maternal Grandmother    Heart disease Maternal Grandfather    Heart attack Maternal Grandfather    Heart disease Paternal Grandmother        CAD   Stomach cancer Paternal Grandfather    Pancreatic cancer Neg Hx    Esophageal cancer Neg Hx    Liver disease Neg Hx    Colon cancer Neg Hx    Inflammatory bowel disease Neg Hx    Rectal cancer Neg Hx       Objective:  Blood pressure 127/89, pulse 72, height 5\' 10"  (1.778 m), weight 214 lb (97.1 kg), SpO2 98%. General: No acute distress.  Patient appears well-groomed.   Head:  Normocephalic/atraumatic Neck:  Supple.  No paraspinal tenderness.  Full range of motion. Heart:  Regular rate and rhythm. Neuro:  Alert and oriented.  Speech fluent and not dysarthric.  Language intact.  CN II-XII intact.  Bulk and tone normal.  Muscle strength 5/5 throughout.  Deep tendon reflexes 2+ throughout.  Gait normal.  Romberg negative.   Shon Millet, DO  CC: Olive Bass, FNP

## 2023-03-12 ENCOUNTER — Encounter: Payer: Self-pay | Admitting: Neurology

## 2023-03-12 ENCOUNTER — Ambulatory Visit (INDEPENDENT_AMBULATORY_CARE_PROVIDER_SITE_OTHER): Payer: Managed Care, Other (non HMO) | Admitting: Neurology

## 2023-03-12 VITALS — BP 127/89 | HR 72 | Ht 70.0 in | Wt 214.0 lb

## 2023-03-12 DIAGNOSIS — H9313 Tinnitus, bilateral: Secondary | ICD-10-CM

## 2023-03-12 DIAGNOSIS — G43009 Migraine without aura, not intractable, without status migrainosus: Secondary | ICD-10-CM

## 2023-03-12 MED ORDER — UBRELVY 100 MG PO TABS: 1.0000 | ORAL_TABLET | ORAL | 3 refills | Status: DC | PRN

## 2023-03-12 MED ORDER — QULIPTA 60 MG PO TABS: 1.0000 | ORAL_TABLET | Freq: Every day | ORAL | 3 refills | Status: DC

## 2023-03-12 NOTE — Patient Instructions (Signed)
Qulipta 60mg  daily Ubrelvy 100mg  as needed

## 2023-03-14 ENCOUNTER — Other Ambulatory Visit: Payer: Self-pay | Admitting: Neurology

## 2023-03-15 ENCOUNTER — Telehealth: Payer: Self-pay

## 2023-03-22 ENCOUNTER — Telehealth: Payer: Self-pay | Admitting: Pharmacy Technician

## 2023-03-22 NOTE — Telephone Encounter (Signed)
 Pharmacy Patient Advocate Encounter   Received notification from CoverMyMeds that prior authorization for QULIPTA  60MG  is required/requested.   Insurance verification completed.   The patient is insured through [employer] [facility] .   Per test claim: PA required; PA submitted to above mentioned insurance via CoverMyMeds Key/confirmation #/EOC [id number] Status is pending

## 2023-04-05 ENCOUNTER — Other Ambulatory Visit (HOSPITAL_COMMUNITY): Payer: Self-pay

## 2023-04-05 NOTE — Telephone Encounter (Signed)
 Pharmacy Patient Advocate Encounter  Received notification from [employer] [facility] that Prior Authorization for Qulipta 60MG  tablets has been CANCELLED due to no prior authorization is required.   Insurance pays a maximum of 30 days at a time. Co-pay is $0.00   PA #/Case ID/Reference #: [id number]

## 2023-04-06 NOTE — Telephone Encounter (Signed)
 Patient advised Via [employer]

## 2023-05-23 ENCOUNTER — Other Ambulatory Visit: Payer: Self-pay | Admitting: Medical Genetics

## 2023-05-25 ENCOUNTER — Other Ambulatory Visit

## 2023-05-25 DIAGNOSIS — Z006 Encounter for examination for normal comparison and control in clinical research program: Secondary | ICD-10-CM

## 2023-06-26 ENCOUNTER — Other Ambulatory Visit: Payer: Self-pay

## 2023-06-26 DIAGNOSIS — E291 Testicular hypofunction: Secondary | ICD-10-CM

## 2023-07-11 ENCOUNTER — Other Ambulatory Visit: Payer: Managed Care, Other (non HMO)

## 2023-07-11 DIAGNOSIS — E291 Testicular hypofunction: Secondary | ICD-10-CM

## 2023-07-11 LAB — GENECONNECT MOLECULAR SCREEN: Genetic Analysis Overall Interpretation: NEGATIVE

## 2023-07-17 ENCOUNTER — Encounter: Payer: Self-pay | Admitting: Family

## 2023-07-17 ENCOUNTER — Ambulatory Visit (INDEPENDENT_AMBULATORY_CARE_PROVIDER_SITE_OTHER): Payer: Managed Care, Other (non HMO) | Admitting: Urology

## 2023-07-17 ENCOUNTER — Encounter: Payer: Self-pay | Admitting: Urology

## 2023-07-17 VITALS — BP 150/91 | HR 77 | Ht 70.0 in | Wt 210.0 lb

## 2023-07-17 DIAGNOSIS — E291 Testicular hypofunction: Secondary | ICD-10-CM | POA: Diagnosis not present

## 2023-07-17 MED ORDER — CLOMIPHENE CITRATE 50 MG PO TABS
50.0000 mg | ORAL_TABLET | ORAL | 3 refills | Status: AC
Start: 2023-07-17 — End: ?

## 2023-07-17 NOTE — Progress Notes (Signed)
 Assessment: 1. Hypogonadism in male     Plan: I personally reviewed the patient's chart including provider notes, and lab results. Continue Clomid  50 mg every other day. He has had an excellent response to the Clomid  as far as his testosterone  level.  I advised him that if he continues to have symptoms of decreased energy and fatigue these are likely not due to a low testosterone  level and that there may be other reasons.  Recommend evaluation by his PCP. Return to office in 6 months. Labs next visit:  testosterone , SHBG, albumin, estradiol  Chief Complaint: Chief Complaint  Patient presents with   Hypogonadism    HPI: Bradley Lawson is a 43 y.o. male who presents for continued evaluation of  hypogonadism. He was previously followed by Dr. Del Favia and was last seen in December 2024. He has been managed with Clomid  50 mg every other day since May 2024. He reports improvement in his hypogonadal symptoms. Labs from 7/24: Testosterone  664  Labs from 5/25: Testosterone  720 SHBG  20.6  He continues on Clomid  50 mg every other day.  He continues to have symptoms of decreased energy and fatigue.  He feels like the symptoms are similar to his symptoms prior to starting Clomid .   Urologic History: Patient has had low testosterone  going back to 2018.  Initial level at that time was 34.  Patient actually had extensive evaluation at that time was found to have normal gonadotropins and other pituitary hormones.  He also underwent an MRI of the brain to assess his pituitary which was normal.  He was started on exogenous testosterone -AndroGel  and took that for approximately 6 months or so but noticed no change in his symptoms.  He was very minimally symptomatic at that time.  He had not taken any supplemental testosterone  since that time.   Labs from 5/24 showed a total testosterone  208 with low bioavailable testosterone  of 145. He reported some hypogonadal symptoms including lethargy and poor  exercise recovery.  He also reported some diminished libido.  No ED problems. Following discussion of options, he elected to begin Clomid  50 mg every other day (although he stated that he was not interested in further fertility).   Portions of the above documentation were copied from a prior visit for review purposes only.  Allergies: Allergies  Allergen Reactions   Clindamycin/Lincomycin    Sulfa Antibiotics     PMH: Past Medical History:  Diagnosis Date   Abnormal CT of the chest 12/13/2015   Formatting of this note might be different from the original. 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   Allergy    seasonal   Anxiety    Chronic pain of both shoulders 11/23/2014   Colon polyps    Hyperlipidemia 11/12/2020   Migraine headache    Osteoarthritis    shoulder   Palpitation    cleared by cardiologist as benign   Shoulder pain    left    PSH: Past Surgical History:  Procedure Laterality Date   COLONOSCOPY     UPPER GASTROINTESTINAL ENDOSCOPY      SH: Social History   Tobacco Use   Smoking status: Never   Smokeless tobacco: Never  Vaping Use   Vaping status: Never Used  Substance Use Topics   Alcohol use: Yes    Comment: 2-3 on weekends   Drug use: Never    ROS: Constitutional:  Negative for fever, chills, weight loss CV: Negative for  chest pain, previous MI, hypertension Respiratory:  Negative for shortness of breath, wheezing, sleep apnea, frequent cough GI:  Negative for nausea, vomiting, bloody stool, GERD  PE: BP (!) 150/91   Pulse 77   Ht 5\' 10"  (1.778 m)   Wt 210 lb (95.3 kg)   BMI 30.13 kg/m  GENERAL APPEARANCE:  Well appearing, well developed, well nourished, NAD HEENT:  Atraumatic, normocephalic, oropharynx clear NECK:  Supple without lymphadenopathy or thyromegaly ABDOMEN:  Soft, non-tender, no masses EXTREMITIES:  Moves all extremities well, without clubbing, cyanosis, or  edema NEUROLOGIC:  Alert and oriented x 3, normal gait, CN II-XII grossly intact MENTAL STATUS:  appropriate BACK:  Non-tender to palpation, No CVAT SKIN:  Warm, dry, and intact   Results: None

## 2023-07-19 ENCOUNTER — Encounter: Payer: Self-pay | Admitting: Urology

## 2023-07-19 ENCOUNTER — Ambulatory Visit: Admitting: Family

## 2023-07-19 ENCOUNTER — Encounter: Payer: Self-pay | Admitting: Neurology

## 2023-07-19 VITALS — BP 126/76 | HR 67 | Ht 70.0 in | Wt 212.8 lb

## 2023-07-19 DIAGNOSIS — R5383 Other fatigue: Secondary | ICD-10-CM

## 2023-07-19 DIAGNOSIS — E559 Vitamin D deficiency, unspecified: Secondary | ICD-10-CM | POA: Diagnosis not present

## 2023-07-19 DIAGNOSIS — M791 Myalgia, unspecified site: Secondary | ICD-10-CM

## 2023-07-19 LAB — CBC WITH DIFFERENTIAL/PLATELET
Basophils Absolute: 0 K/uL (ref 0.0–0.1)
Basophils Relative: 0.3 % (ref 0.0–3.0)
Eosinophils Absolute: 0.1 K/uL (ref 0.0–0.7)
Eosinophils Relative: 1.1 % (ref 0.0–5.0)
HCT: 49.9 % (ref 39.0–52.0)
Hemoglobin: 16.7 g/dL (ref 13.0–17.0)
Lymphocytes Relative: 34 % (ref 12.0–46.0)
Lymphs Abs: 2.4 K/uL (ref 0.7–4.0)
MCHC: 33.5 g/dL (ref 30.0–36.0)
MCV: 88.4 fl (ref 78.0–100.0)
Monocytes Absolute: 0.4 K/uL (ref 0.1–1.0)
Monocytes Relative: 5.2 % (ref 3.0–12.0)
Neutro Abs: 4.3 K/uL (ref 1.4–7.7)
Neutrophils Relative %: 59.4 % (ref 43.0–77.0)
Platelets: 209 K/uL (ref 150.0–400.0)
RBC: 5.65 Mil/uL (ref 4.22–5.81)
RDW: 13.3 % (ref 11.5–15.5)
WBC: 7.2 K/uL (ref 4.0–10.5)

## 2023-07-19 LAB — COMPREHENSIVE METABOLIC PANEL WITH GFR
ALT: 27 U/L (ref 0–53)
AST: 24 U/L (ref 0–37)
Albumin: 4.5 g/dL (ref 3.5–5.2)
Alkaline Phosphatase: 40 U/L (ref 39–117)
BUN: 15 mg/dL (ref 6–23)
CO2: 27 meq/L (ref 19–32)
Calcium: 9.5 mg/dL (ref 8.4–10.5)
Chloride: 104 meq/L (ref 96–112)
Creatinine, Ser: 1.14 mg/dL (ref 0.40–1.50)
GFR: 80.27 mL/min (ref >60.00)
Glucose, Bld: 93 mg/dL (ref 70–99)
Potassium: 4.5 meq/L (ref 3.5–5.1)
Sodium: 141 meq/L (ref 135–145)
Total Bilirubin: 0.7 mg/dL (ref 0.2–1.2)
Total Protein: 6.7 g/dL (ref 6.0–8.3)

## 2023-07-19 LAB — VITAMIN D 25 HYDROXY (VIT D DEFICIENCY, FRACTURES): VITD: 26.77 ng/mL — ABNORMAL LOW (ref 30.00–100.00)

## 2023-07-19 LAB — HORMONE PANEL (T4,TSH,FSH,TESTT,SHBG,DHEA,ETC)
DHEA-Sulfate, LCMS: 137 ug/dL
Estradiol, Serum, MS: 57 pg/mL — ABNORMAL HIGH
Estrone Sulfate: 399 ng/dL — ABNORMAL HIGH
Follicle Stimulating Hormone: 14 m[IU]/mL — ABNORMAL HIGH
Free T-3: 3.6 pg/mL
Free Testosterone, Serum: 158 pg/mL
Progesterone, Serum: <10 ng/dL
Sex Hormone Binding Globulin: 20.6 nmol/L
T4: 6 ug/dL
TSH: 1.4 uU/mL
Testosterone, Serum (Total): 688 ng/dL
Testosterone-% Free: 2.3 %
Triiodothyronine (T-3), Serum: 116 ng/dL

## 2023-07-19 LAB — IBC + FERRITIN
Ferritin: 46 ng/mL (ref 22.0–322.0)
Iron: 132 ug/dL (ref 42–165)
Saturation Ratios: 38 % (ref 20.0–50.0)
TIBC: 347.2 ug/dL (ref 250.0–450.0)
Transferrin: 248 mg/dL (ref 212.0–360.0)

## 2023-07-19 LAB — TESTOSTERONE: Testosterone: 720 ng/dL (ref 264–916)

## 2023-07-19 LAB — TSH: TSH: 1.39 u[IU]/mL (ref 0.35–5.50)

## 2023-07-19 LAB — SEDIMENTATION RATE: Sed Rate: <1 mm/h (ref 0–15)

## 2023-07-19 LAB — VITAMIN B12: Vitamin B-12: 466 pg/mL (ref 211–911)

## 2023-07-19 NOTE — Patient Instructions (Signed)
 Ask Dr. Festus Hubert what his experience with Quilipta has been for patients that have taken that medication for extended period of time;

## 2023-07-19 NOTE — Progress Notes (Signed)
 Bradley Lawson is a 43 y.o. male with the following history as recorded in EpicCare:  Patient Active Problem List   Diagnosis Date Noted   Palpitations 11/16/2020   Dyspnea on exertion 11/16/2020   Shoulder pain 11/12/2020   Osteoarthritis 11/12/2020   Migraine headache 11/12/2020   Colon polyps 11/12/2020   Anxiety 11/12/2020   Allergy 11/12/2020   Hyperlipidemia 11/12/2020   Intestinal metaplasia of stomach 10/01/2020   History of esophageal ulcer 06/30/2020   Hx of adenomatous colonic polyps 06/30/2020   Loose bowel movements 06/30/2020   History of Clostridioides difficile infection 12/01/2019   Adenomatous colon polyp 12/01/2019   Abnormal CT of the chest 12/13/2015   Localized primary osteoarthritis of shoulder regions, bilateral 11/23/2014   Chronic pain of both shoulders 11/23/2014   Chronic nonintractable headache 11/17/2014    Current Outpatient Medications  Medication Sig Dispense Refill   Atogepant  (QULIPTA ) 60 MG TABS Take 1 tablet (60 mg total) by mouth daily. 90 tablet 3   clindamycin (CLEOCIN T) 1 % external solution Apply 1 application  topically 2 (two) times daily.     clomiPHENE  (CLOMID ) 50 MG tablet Take 1 tablet (50 mg total) by mouth every other day. 45 tablet 3   loratadine (CLARITIN) 10 MG tablet Take 10 mg by mouth daily. Seasonal allergies     LORazepam  (ATIVAN ) 0.5 MG tablet 1 tablet q 30-45 minutes prior to flight as needed 20 tablet 0   Ubrogepant  (UBRELVY ) 100 MG TABS Take 1 tablet (100 mg total) by mouth as needed (May repeat in 2 hours.  Maximum 2 tablets in 24 hours.). 30 tablet 3   No current facility-administered medications for this visit.    Allergies: Clindamycin/lincomycin and Sulfa antibiotics  Past Medical History:  Diagnosis Date   Abnormal CT of the chest 12/13/2015   Formatting of this note might be different from the original. 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    Allergy    seasonal   Anxiety    Chronic pain of both shoulders 11/23/2014   Colon polyps    Hyperlipidemia 11/12/2020   Migraine headache    Osteoarthritis    shoulder   Palpitation    cleared by cardiologist as benign   Shoulder pain    left    Past Surgical History:  Procedure Laterality Date   COLONOSCOPY     UPPER GASTROINTESTINAL ENDOSCOPY      Family History  Problem Relation Age of Onset   Colon polyps Mother    Irritable bowel syndrome Mother    Anemia Mother    Diverticulosis Father    Colon polyps Father    Irritable bowel syndrome Brother    Colon polyps Maternal Grandmother    Heart disease Maternal Grandfather    Heart attack Maternal Grandfather    Heart disease Paternal Grandmother        CAD   Stomach cancer Paternal Grandfather    Pancreatic cancer Neg Hx    Esophageal cancer Neg Hx    Liver disease Neg Hx    Colon cancer Neg Hx    Inflammatory bowel disease Neg Hx    Rectal cancer Neg Hx     Social History   Tobacco Use   Smoking status: Never   Smokeless tobacco: Never  Substance Use Topics   Alcohol use: Yes    Comment: 2-3 on weekends    Subjective:   Complaining of persisting fatigue- seemed to start  in April and coincide with allergy season; seen last year with similar symptoms- after that visit in 2024, testosterone  was noted to be very low; has been taking testosterone  treatment with urology but no improvement in symptoms; denies any concerns for feeling tired; no concerns for sleeping- 8 hours/ not snoring; admits that diet is not good; does feel that exercise routine is better; is scheduled for colonoscopy in August 2025 due to 3 year recall but no changes in bowel movements; no persisting issues with migraines; no chest pain or shortness of breath on exertion;   Objective:  Vitals:   07/19/23 0845  BP: 126/76  Pulse: 67  SpO2: 97%  Weight: 212 lb 12.8 oz (96.5 kg)  Height: 5\' 10"  (1.778 m)    General: Well developed, well  nourished, in no acute distress  Skin : Warm and dry.  Head: Normocephalic and atraumatic  Eyes: Sclera and conjunctiva clear; pupils round and reactive to light; extraocular movements intact  Ears: External normal; canals clear; tympanic membranes normal  Oropharynx: Pink, supple. No suspicious lesions  Neck: Supple without thyromegaly, adenopathy  Lungs: Respirations unlabored; clear to auscultation bilaterally without wheeze, rales, rhonchi  CVS exam: normal rate and regular rhythm.  Musculoskeletal: No deformities; no active joint inflammation  Extremities: No edema, cyanosis, clubbing  Vessels: Symmetric bilaterally  Neurologic: Alert and oriented; speech intact; face symmetrical; moves all extremities well; CNII-XII intact without focal deficit   Assessment:  1. Myalgia   2. Other fatigue   3. Vitamin D  deficiency     Plan:  ? Etiology; patient denies any concerns for underlying depression or anxiety; will update labs today; will get copy of most recent dermatology note- ? If could have some underlying immune source- may need to refer back to asthma/ allergy;  He will also reach out to his neurologist to see if this could be atypical side effect of Qulipta ;  Follow up to be determined; he is also encouraged to reach out to GI to get scheduled for repeat colonoscopy due later this summer;   No follow-ups on file.  Orders Placed This Encounter  Procedures   Antinuclear Antib (ANA)   Sedimentation rate   TSH   CBC with Differential/Platelet   Comp Met (CMET)   B12   IBC + Ferritin   Vitamin D  (25 hydroxy)    Requested Prescriptions    No prescriptions requested or ordered in this encounter

## 2023-07-20 LAB — ANA: Anti Nuclear Antibody (ANA): NEGATIVE

## 2023-07-23 ENCOUNTER — Other Ambulatory Visit: Payer: Self-pay | Admitting: Family

## 2023-07-23 ENCOUNTER — Ambulatory Visit: Payer: Self-pay | Admitting: Family

## 2023-07-23 MED ORDER — VITAMIN D (ERGOCALCIFEROL) 1.25 MG (50000 UNIT) PO CAPS: 50000.0000 [IU] | ORAL_CAPSULE | ORAL | 0 refills | Status: AC

## 2023-07-24 ENCOUNTER — Other Ambulatory Visit: Payer: Self-pay | Admitting: Urology

## 2023-07-24 ENCOUNTER — Ambulatory Visit: Payer: Self-pay | Admitting: Urology

## 2023-07-24 DIAGNOSIS — E291 Testicular hypofunction: Secondary | ICD-10-CM

## 2023-07-24 MED ORDER — ANASTROZOLE 1 MG PO TABS: 1.0000 mg | ORAL_TABLET | Freq: Every day | ORAL | 5 refills | Status: AC

## 2023-08-22 ENCOUNTER — Encounter: Payer: Self-pay | Admitting: Family

## 2023-08-24 ENCOUNTER — Ambulatory Visit (INDEPENDENT_AMBULATORY_CARE_PROVIDER_SITE_OTHER): Admitting: Family

## 2023-08-24 VITALS — BP 146/88 | HR 94 | Temp 98.1°F | Ht 70.0 in | Wt 212.0 lb

## 2023-08-24 DIAGNOSIS — F418 Other specified anxiety disorders: Secondary | ICD-10-CM

## 2023-08-24 DIAGNOSIS — J029 Acute pharyngitis, unspecified: Secondary | ICD-10-CM | POA: Diagnosis not present

## 2023-08-24 DIAGNOSIS — G4700 Insomnia, unspecified: Secondary | ICD-10-CM | POA: Diagnosis not present

## 2023-08-24 MED ORDER — ZOLPIDEM TARTRATE 10 MG PO TABS: 10.0000 mg | ORAL_TABLET | Freq: Every evening | ORAL | 0 refills | Status: AC | PRN

## 2023-08-24 MED ORDER — LORAZEPAM 0.5 MG PO TABS: ORAL_TABLET | ORAL | 0 refills | Status: AC

## 2023-08-24 MED ORDER — DOXYCYCLINE HYCLATE 100 MG PO TABS: 100.0000 mg | ORAL_TABLET | Freq: Two times a day (BID) | ORAL | 0 refills | Status: AC

## 2023-08-24 NOTE — Progress Notes (Signed)
 Bradley Lawson is a 43 y.o. male with the following history as recorded in [employer]:  Patient Active Problem List   Diagnosis Date Noted   Palpitations 11/16/2020   Dyspnea on exertion 11/16/2020   Shoulder pain 11/12/2020   Osteoarthritis 11/12/2020   Migraine headache 11/12/2020   Colon polyps 11/12/2020   Anxiety 11/12/2020   Allergy 11/12/2020   Hyperlipidemia 11/12/2020   Intestinal metaplasia of stomach 10/01/2020   History of esophageal ulcer 06/30/2020   Hx of adenomatous colonic polyps 06/30/2020   Loose bowel movements 06/30/2020   History of Clostridioides difficile infection 12/01/2019   Adenomatous colon polyp 12/01/2019   Abnormal CT of the chest 12/13/2015   Localized primary osteoarthritis of shoulder regions, bilateral 11/23/2014   Chronic pain of both shoulders 11/23/2014   Chronic nonintractable headache 11/17/2014    Current Outpatient Medications  Medication Sig Dispense Refill   anastrozole  (ARIMIDEX ) 1 MG tablet Take 1 tablet (1 mg total) by mouth daily. 30 tablet 5   Atogepant  (QULIPTA ) 60 MG TABS Take 1 tablet (60 mg total) by mouth daily. 90 tablet 3   clindamycin (CLEOCIN T) 1 % external solution Apply 1 application  topically 2 (two) times daily.     clomiPHENE  (CLOMID ) 50 MG tablet Take 1 tablet (50 mg total) by mouth every other day. 45 tablet 3   doxycycline  (VIBRA -TABS) 100 MG tablet Take 1 tablet (100 mg total) by mouth 2 (two) times daily for 5 days. 10 tablet 0   loratadine (CLARITIN) 10 MG tablet Take 10 mg by mouth daily. Seasonal allergies     Ubrogepant  (UBRELVY ) 100 MG TABS Take 1 tablet (100 mg total) by mouth as needed (May repeat in 2 hours.  Maximum 2 tablets in 24 hours.). 30 tablet 3   Vitamin D , Ergocalciferol , (DRISDOL ) 1.25 MG (50000 UNIT) CAPS capsule Take 1 capsule (50,000 Units total) by mouth every 7 (seven) days for 12 doses. 12 capsule 0   zolpidem  (AMBIEN ) 10 MG tablet Take 1 tablet (10 mg total) by mouth at bedtime as  needed for sleep. 15 tablet 0   LORazepam  (ATIVAN ) 0.5 MG tablet 1 tablet q 30-45 minutes prior to flight as needed 20 tablet 0   No current facility-administered medications for this visit.    Allergies: Clindamycin/lincomycin and Sulfa antibiotics  Past Medical History:  Diagnosis Date   Abnormal CT of the chest 12/13/2015   Formatting of this note might be different from the original. 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   Allergy    seasonal   Anxiety    Chronic pain of both shoulders 11/23/2014   Colon polyps    Hyperlipidemia 11/12/2020   Migraine headache    Osteoarthritis    shoulder   Palpitation    cleared by cardiologist as benign   Shoulder pain    left    Past Surgical History:  Procedure Laterality Date   COLONOSCOPY     UPPER GASTROINTESTINAL ENDOSCOPY      Family History  Problem Relation Age of Onset   Colon polyps Mother    Irritable bowel syndrome Mother    Anemia Mother    Diverticulosis Father    Colon polyps Father    Irritable bowel syndrome Brother    Colon polyps Maternal Grandmother    Heart disease Maternal Grandfather    Heart attack Maternal Grandfather    Heart disease Paternal Grandmother        CAD  Stomach cancer Paternal Grandfather    Pancreatic cancer Neg Hx    Esophageal cancer Neg Hx    Liver disease Neg Hx    Colon cancer Neg Hx    Inflammatory bowel disease Neg Hx    Rectal cancer Neg Hx     Social History   Tobacco Use   Smoking status: Never   Smokeless tobacco: Never  Substance Use Topics   Alcohol use: Yes    Comment: 2-3 on weekends    Subjective:    2-3 day history of sore area on left side of neck- outside feels sore to touch; no fever/ some mild congestion/ my stomach feels upset. No known sick contacts; does feel that symptoms have worsened over the past 2 days; no pain with swallowing;   Also requesting referral on Ativan  and Ambien ;      Objective:  Vitals:   08/24/23 1431  BP: (!) 146/88  Pulse: 94  Temp: 98.1 F (36.7 C)  TempSrc: Oral  SpO2: 97%  Weight: 212 lb (96.2 kg)  Height: 5' 10 (1.778 m)    General: Well developed, well nourished, in no acute distress  Skin : Warm and dry.  Head: Normocephalic and atraumatic  Eyes: Sclera and conjunctiva clear; pupils round and reactive to light; extraocular movements intact  Ears: External normal; canals clear; tympanic membranes normal  Oropharynx: Pink, supple. No suspicious lesions  Neck: Supple without thyromegaly, adenopathy  Lungs: Respirations unlabored; clear to auscultation bilaterally without wheeze, rales, rhonchi  CVS exam: normal rate and regular rhythm.  Neurologic: Alert and oriented; speech intact; face symmetrical; moves all extremities well; CNII-XII intact without focal deficit   Assessment:  1. Sore throat   2. Situational anxiety   3. Insomnia, unspecified type     Plan:  Physical exam is reassuring; however, discussed possibility of reactive lymphadenopathy; check CBC, CMP, mono today; Rx for Doxycycline  100 mg bid x 5 days; follow up worse, no better. If symptoms persist, to consider neck ultrasound; Stable; refill updated; Stable; refill updated; he is aware not to mix the Ambien  and Ativan ;   No follow-ups on file.  Orders Placed This Encounter  Procedures   CBC with Differential/Platelet   Mononucleosis screen   Comp Met (CMET)    Requested Prescriptions   Signed Prescriptions Disp Refills   doxycycline  (VIBRA -TABS) 100 MG tablet 10 tablet 0    Sig: Take 1 tablet (100 mg total) by mouth 2 (two) times daily for 5 days.   LORazepam  (ATIVAN ) 0.5 MG tablet 20 tablet 0    Sig: 1 tablet q 30-45 minutes prior to flight as needed   zolpidem  (AMBIEN ) 10 MG tablet 15 tablet 0    Sig: Take 1 tablet (10 mg total) by mouth at bedtime as needed for sleep.

## 2023-08-24 NOTE — Telephone Encounter (Signed)
 Patient has in person appointment today at 2:40; will address refill requests at that time.

## 2023-08-25 LAB — COMPREHENSIVE METABOLIC PANEL WITH GFR
AG Ratio: 1.9 (calc) (ref 1.0–2.5)
ALT: 22 U/L (ref 9–46)
AST: 19 U/L (ref 10–40)
Albumin: 4.5 g/dL (ref 3.6–5.1)
Alkaline phosphatase (APISO): 42 U/L (ref 36–130)
BUN/Creatinine Ratio: 9 (calc) (ref 6–22)
BUN: 12 mg/dL (ref 7–25)
CO2: 23 mmol/L (ref 20–32)
Calcium: 9.1 mg/dL (ref 8.6–10.3)
Chloride: 105 mmol/L (ref 98–110)
Creat: 1.35 mg/dL — ABNORMAL HIGH (ref 0.60–1.29)
Globulin: 2.4 g/dL (ref 1.9–3.7)
Glucose, Bld: 89 mg/dL (ref 65–99)
Potassium: 4.2 mmol/L (ref 3.5–5.3)
Sodium: 142 mmol/L (ref 135–146)
Total Bilirubin: 0.5 mg/dL (ref 0.2–1.2)
Total Protein: 6.9 g/dL (ref 6.1–8.1)
eGFR: 68 mL/min/1.73m2 (ref >=60)

## 2023-08-25 LAB — CBC WITH DIFFERENTIAL/PLATELET
Absolute Lymphocytes: 2442 {cells}/uL (ref 850–3900)
Absolute Monocytes: 494 {cells}/uL (ref 200–950)
Basophils Absolute: 19 {cells}/uL (ref 0–200)
Basophils Relative: 0.2 %
Eosinophils Absolute: 86 {cells}/uL (ref 15–500)
Eosinophils Relative: 0.9 %
HCT: 52.2 % — ABNORMAL HIGH (ref 38.5–50.0)
Hemoglobin: 17.5 g/dL — ABNORMAL HIGH (ref 13.2–17.1)
MCH: 30 pg (ref 27.0–33.0)
MCHC: 33.5 g/dL (ref 32.0–36.0)
MCV: 89.5 fL (ref 80.0–100.0)
MPV: 9.5 fL (ref 7.5–12.5)
Monocytes Relative: 5.2 %
Neutro Abs: 6460 {cells}/uL (ref 1500–7800)
Neutrophils Relative %: 68 %
Platelets: 231 Thousand/uL (ref 140–400)
RBC: 5.83 Million/uL — ABNORMAL HIGH (ref 4.20–5.80)
RDW: 12.8 % (ref 11.0–15.0)
Total Lymphocyte: 25.7 %
WBC: 9.5 Thousand/uL (ref 3.8–10.8)

## 2023-08-25 LAB — MONONUCLEOSIS SCREEN: Heterophile, Mono Screen: NEGATIVE

## 2023-08-27 ENCOUNTER — Ambulatory Visit: Payer: Self-pay | Admitting: Family

## 2023-08-28 ENCOUNTER — Encounter: Payer: Self-pay | Admitting: Family

## 2023-08-29 ENCOUNTER — Encounter: Payer: Self-pay | Admitting: Urology

## 2023-08-29 ENCOUNTER — Other Ambulatory Visit: Payer: Self-pay | Admitting: Urology

## 2023-08-29 DIAGNOSIS — E291 Testicular hypofunction: Secondary | ICD-10-CM

## 2023-09-03 ENCOUNTER — Other Ambulatory Visit

## 2023-09-03 DIAGNOSIS — E291 Testicular hypofunction: Secondary | ICD-10-CM

## 2023-09-04 ENCOUNTER — Ambulatory Visit: Payer: Self-pay | Admitting: Urology

## 2023-09-04 LAB — ESTRADIOL: Estradiol: 34.4 pg/mL (ref 7.6–42.6)

## 2023-09-04 LAB — CBC
Hematocrit: 49.7 % (ref 37.5–51.0)
Hemoglobin: 16.6 g/dL (ref 13.0–17.7)
MCH: 29.8 pg (ref 26.6–33.0)
MCHC: 33.4 g/dL (ref 31.5–35.7)
MCV: 89 fL (ref 79–97)
Platelets: 186 x10E3/uL (ref 150–450)
RBC: 5.57 x10E6/uL (ref 4.14–5.80)
RDW: 12.8 % (ref 11.6–15.4)
WBC: 7.7 x10E3/uL (ref 3.4–10.8)

## 2023-09-04 LAB — TESTOSTERONE: Testosterone: 684 ng/dL (ref 264–916)

## 2023-09-04 LAB — SEX HORMONE BINDING GLOBULIN: Sex Hormone Binding: 17.1 nmol/L (ref 16.5–55.9)

## 2023-09-04 LAB — ALBUMIN: Albumin: 4.3 g/dL (ref 4.1–5.1)

## 2023-10-03 ENCOUNTER — Other Ambulatory Visit: Payer: Self-pay | Admitting: Family

## 2023-12-06 ENCOUNTER — Other Ambulatory Visit (HOSPITAL_COMMUNITY): Payer: Self-pay

## 2024-01-16 ENCOUNTER — Encounter: Payer: Self-pay | Admitting: Urology

## 2024-01-16 ENCOUNTER — Ambulatory Visit: Admitting: Urology

## 2024-01-16 VITALS — BP 131/85 | HR 76 | Ht 70.0 in | Wt 210.0 lb

## 2024-01-16 DIAGNOSIS — E291 Testicular hypofunction: Secondary | ICD-10-CM | POA: Insufficient documentation

## 2024-01-16 DIAGNOSIS — Z5181 Encounter for therapeutic drug level monitoring: Secondary | ICD-10-CM | POA: Diagnosis not present

## 2024-01-16 DIAGNOSIS — R350 Frequency of micturition: Secondary | ICD-10-CM | POA: Diagnosis not present

## 2024-01-16 DIAGNOSIS — Z79899 Other long term (current) drug therapy: Secondary | ICD-10-CM

## 2024-01-16 LAB — BLADDER SCAN AMB NON-IMAGING

## 2024-01-16 NOTE — Progress Notes (Signed)
 Assessment: 1. Hypogonadism in male   2. Urinary frequency     Plan: Continue Clomid  50 mg every other day. Labs today:  testosterone , albumin, SHBG, estradiol , CBC I discussed possible causes of his lower urinary tract symptoms.  His symptoms are consistent with overactive bladder.  I discussed avoidance of dietary irritants to see if this might improve his lower urinary tract symptoms.  If not, we could consider a trial of medical therapy with a beta 3 agonist such as Myrbetriq.  He will contact the office if he wishes to try medication. Return to office in 6 months.  Chief Complaint: Chief Complaint  Patient presents with   Hypogonadism    HPI: Bradley Lawson is a 43 y.o. male who presents for continued evaluation of male hypogonadism. He was previously followed by Dr. Shona and was last seen in December 2024. He has been managed with Clomid  50 mg every other day since May 2024. He reports improvement in his hypogonadal symptoms. Labs from 7/24: Testosterone  664  Labs from 5/25: Testosterone  720 SHBG  20.6  At his visit in June 2025, he continued on Clomid  50 mg every other day.  He continued to have symptoms of decreased energy and fatigue.  He felt like his symptoms were similar to his symptoms prior to starting Clomid .  Labs from 6/25: Testosterone  688 Estradiol  57  He was continued on Clomid  50 mg every other day and started on Arimidex  1 mg daily. Labs from 7/25: Testosterone  684     Bioavailable T = 481 Estradiol  34.4 H&H  16.6/49.7  He returns today for follow-up.  He continues on Clomid  50 mg every other day.  He is no longer taking the Arimidex  due to abnormal test results.  He states this was due to abnormal lab values which I do not have record of.  He reports that his hypogonadal symptoms are well-controlled. He is reporting gradually worsening lower urinary tract symptoms with daytime frequency, voiding every 30 minutes, hesitancy, and sensation of  incomplete emptying.  No nocturia, dysuria, or gross hematuria.    Urologic History: Patient has had low testosterone  going back to 2018.  Initial level at that time was 34.  Patient actually had extensive evaluation at that time was found to have normal gonadotropins and other pituitary hormones.  He also underwent an MRI of the brain to assess his pituitary which was normal.  He was started on exogenous testosterone -AndroGel  and took that for approximately 6 months or so but noticed no change in his symptoms.  He was very minimally symptomatic at that time.  He had not taken any supplemental testosterone  since that time.   Labs from 5/24 showed a total testosterone  208 with low bioavailable testosterone  of 145. He reported some hypogonadal symptoms including lethargy and poor exercise recovery.  He also reported some diminished libido.  No ED problems. Following discussion of options, he elected to begin Clomid  50 mg every other day (although he stated that he was not interested in further fertility).   Portions of the above documentation were copied from a prior visit for review purposes only.  Allergies: Allergies  Allergen Reactions   Clindamycin/Lincomycin    Sulfa Antibiotics     PMH: Past Medical History:  Diagnosis Date   Abnormal CT of the chest 12/13/2015   Formatting of this note might be different from the original. 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   Allergy  seasonal   Anxiety    Chronic pain of both shoulders 11/23/2014   Colon polyps    Hyperlipidemia 11/12/2020   Migraine headache    Osteoarthritis    shoulder   Palpitation    cleared by cardiologist as benign   Shoulder pain    left    PSH: Past Surgical History:  Procedure Laterality Date   COLONOSCOPY     UPPER GASTROINTESTINAL ENDOSCOPY      SH: Social History   Tobacco Use   Smoking status: Never   Smokeless tobacco: Never   Vaping Use   Vaping status: Never Used  Substance Use Topics   Alcohol use: Yes    Comment: 2-3 on weekends   Drug use: Never    ROS: Constitutional:  Negative for fever, chills, weight loss CV: Negative for chest pain, previous MI, hypertension Respiratory:  Negative for shortness of breath, wheezing, sleep apnea, frequent cough GI:  Negative for nausea, vomiting, bloody stool, GERD  PE: BP 131/85   Pulse 76   Ht 5' 10 (1.778 m)   Wt 210 lb (95.3 kg)   BMI 30.13 kg/m  GENERAL APPEARANCE:  Well appearing, well developed, well nourished, NAD HEENT:  Atraumatic, normocephalic, oropharynx clear NECK:  Supple without lymphadenopathy or thyromegaly ABDOMEN:  Soft, non-tender, no masses EXTREMITIES:  Moves all extremities well, without clubbing, cyanosis, or edema NEUROLOGIC:  Alert and oriented x 3, normal gait, CN II-XII grossly intact MENTAL STATUS:  appropriate BACK:  Non-tender to palpation, No CVAT SKIN:  Warm, dry, and intact   Results: No specimen provided  PVR = 7 ml

## 2024-01-17 ENCOUNTER — Ambulatory Visit: Payer: Self-pay | Admitting: Urology

## 2024-01-17 LAB — ALBUMIN: Albumin: 4.6 g/dL (ref 4.1–5.1)

## 2024-01-17 LAB — CBC
Hematocrit: 51.4 % — ABNORMAL HIGH (ref 37.5–51.0)
Hemoglobin: 17.2 g/dL (ref 13.0–17.7)
MCH: 30.2 pg (ref 26.6–33.0)
MCHC: 33.5 g/dL (ref 31.5–35.7)
MCV: 90 fL (ref 79–97)
Platelets: 219 x10E3/uL (ref 150–450)
RBC: 5.69 x10E6/uL (ref 4.14–5.80)
RDW: 13 % (ref 11.6–15.4)
WBC: 8.2 x10E3/uL (ref 3.4–10.8)

## 2024-01-17 LAB — SEX HORMONE BINDING GLOBULIN: Sex Hormone Binding: 18.3 nmol/L (ref 16.5–55.9)

## 2024-01-17 LAB — ESTRADIOL: Estradiol: 41.4 pg/mL (ref 7.6–42.6)

## 2024-01-17 LAB — TESTOSTERONE: Testosterone: 515 ng/dL (ref 264–916)

## 2024-02-08 ENCOUNTER — Other Ambulatory Visit: Payer: Self-pay | Admitting: Neurology

## 2024-03-10 NOTE — Progress Notes (Unsigned)
 "  Virtual Visit via Video Note:   Consent was obtained for video visit:  Yes.   Answered questions that patient had about telehealth interaction:  Yes.   I discussed the limitations, risks, security and privacy concerns of performing an evaluation and management service by telemedicine. I also discussed with the patient that there may be a patient responsible charge related to this service. The patient expressed understanding and agreed to proceed.  Pt location: Home Physician Location: office Name of referring provider:  Jason Leita Repine, FNP I connected with Bradley Lawson at patients initiation/request on 03/11/2024 at  8:50 AM EST by video enabled telemedicine application and verified that I am speaking with the correct person using two identifiers. Pt MRN:  [id number] Pt DOB:  [birth year] Video Participants:  Bradley Lawson  Assessment/Plan:   Migraine without aura, without status migrainosus, not intractable Tinnitus  Migraine prevention:  Qulipta  60mg  daily  Migraine rescue:  Ubrelvy  100mg   Limit use of pain relievers to no more than 2 days out of week to prevent risk of rebound or medication-overuse headache. Keep headache diary May want to consider getting hearing test. Follow up one year      Subjective:  Bradley Lawson is a 44 year old right-handed male who follows up for headaches.   UPDATE: Intensity:  mild Duration:  1 hour with ibuprofen or Ubrelvy  Frequency:  once every one to two weeks. Usually occurs due to triggers.    Tinnitus has been worse over the past year.  No worsening of hearing.  He did start playing guitar over the past year.    Current NSAIDS/analgesics:  none Current triptans:  none Current ergotamine:  none Current anti-emetic:  none Current muscle relaxants:  none Current Antihypertensive medications:  none Current Antidepressant medications:  none Current Anticonvulsant medications:  none Current anti-CGRP:  Qulipta  60mg  QD,  Ubrelvy  100mg  (not needed for a month) Current Vitamins/Herbal/Supplements:  none Current Antihistamines/Decongestants:  Claritin Other therapy:  none Hormone/birth control:  none Other medications:  Ambien    Caffeine:  2 cups of coffee daily.  1 can Coke Zero daily Diet:  36 oz water daily.  Does not skip meals Exercise:  Routine.  Lifts weights Depression:  no; Anxiety:  A little bit Other pain:  no Sleep:  good   CMP from September was normal.     HISTORY:  He reports history of migraines since childhood.  He has been on propranolol  for many years but recently doesn't think it has been effective.  They are moderate bifrontal pressure ache.  Some photophobia and phonophobia.  No nausea, vomiting, visual disturbance, numbness and weakness. They last 3-4 hours.  They occur every other day.  Takes ibuprofen 2 to 3 tablets almost daily.  Triggers include noise, certain smells (perfumes, flowers), stress and sometimes exertion (lifting weights).  Applying pressure on the temples help relieve it.     Previous imaging: 01/23/2013 MRI BRAIN W WO:  Normal study 06/11/2017 MRI BRAIN/PITUITARY W WO:  Normal MRI of the brain and pituitary   Past NSAIDS/analgesics:  Tylenol, Excedrin, ibuprofen Past abortive triptans:  Sumatriptan, Zomig-MLT Past abortive ergotamine:  none Past muscle relaxants:  none Past anti-emetic:  none Past antihypertensive medications:  propranolol  ER 160mg  Past antidepressant medications:  none Past anticonvulsant medications:  topiramate (worsened tinnitus), zonisamide Past anti-CGRP:  none Past vitamins/Herbal/Supplements:  none Past antihistamines/decongestants:  Flonase Other past therapies:  none     Family history of headache:  Father, daughter  Past Medical  History: Past Medical History:  Diagnosis Date   Abnormal CT of the chest 12/13/2015   Formatting of this note might be different from the original. 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 (Patient not taking: Reported on 01/16/2024)   Atogepant  (QULIPTA ) 60 MG TABS TAKE 1 TABLET BY MOUTH EVERY DAY   clindamycin (CLEOCIN T) 1 % external solution Apply 1 application  topically 2 (two) times daily.   clomiPHENE  (CLOMID ) 50 MG tablet Take 1 tablet (50 mg total) by mouth every other day.   loratadine (CLARITIN) 10 MG tablet Take 10 mg by mouth daily. Seasonal allergies   LORazepam  (ATIVAN ) 0.5 MG tablet 1 tablet q 30-45 minutes prior to flight as needed   Ubrogepant  (UBRELVY ) 100 MG TABS Take 1 tablet (100 mg total) by mouth as needed (May repeat in 2 hours.  Maximum 2 tablets in 24 hours.).   zolpidem  (AMBIEN ) 10 MG tablet Take 1 tablet (10 mg total) by mouth at bedtime as needed for sleep.   No facility-administered encounter medications on file as of 03/11/2024.    Allergies: Allergies[1]  Family History: Family History  Problem Relation Age of Onset   Colon polyps Mother    Irritable bowel syndrome Mother    Anemia Mother    Diverticulosis Father    Colon polyps Father    Irritable bowel syndrome Brother    Colon polyps Maternal Grandmother    Heart disease Maternal Grandfather    Heart attack Maternal Grandfather    Heart disease Paternal Grandmother        CAD   Stomach cancer Paternal Grandfather    Pancreatic cancer Neg Hx    Esophageal cancer Neg Hx    Liver disease Neg Hx    Colon cancer Neg Hx    Inflammatory bowel disease Neg Hx    Rectal cancer Neg Hx     Observations/Objective:   No acute  distress.  Alert and oriented.  Speech fluent and not dysarthric.  Language intact.  Eyes orthophoric on primary gaze.  Face symmetric.   Follow up Instructions:      -I discussed the assessment and treatment plan with the patient. The patient was provided an opportunity to ask questions and all were answered. The patient agreed with the plan and demonstrated an understanding of the instructions.   The patient was advised to call back or seek an in-person evaluation if the symptoms worsen or if the condition fails to improve as anticipated.   Juliene Lamar Dunnings, DO     [1]  Allergies Allergen Reactions   Clindamycin/Lincomycin    Sulfa Antibiotics    "

## 2024-03-11 ENCOUNTER — Telehealth: Payer: Managed Care, Other (non HMO) | Admitting: Neurology

## 2024-03-11 ENCOUNTER — Encounter: Payer: Self-pay | Admitting: Neurology

## 2024-03-11 ENCOUNTER — Telehealth: Payer: Self-pay | Admitting: Neurology

## 2024-03-11 DIAGNOSIS — G43009 Migraine without aura, not intractable, without status migrainosus: Secondary | ICD-10-CM

## 2024-03-11 MED ORDER — QULIPTA 60 MG PO TABS: 1.0000 | ORAL_TABLET | Freq: Every day | ORAL | 3 refills | Status: AC

## 2024-03-11 MED ORDER — UBRELVY 100 MG PO TABS: 1.0000 | ORAL_TABLET | ORAL | 3 refills | Status: AC | PRN

## 2024-03-11 NOTE — Telephone Encounter (Signed)
 error

## 2024-03-18 ENCOUNTER — Encounter: Admitting: Family Medicine

## 2024-03-28 ENCOUNTER — Encounter: Admitting: Family Medicine

## 2024-07-16 ENCOUNTER — Ambulatory Visit: Admitting: Urology

## 2025-03-11 ENCOUNTER — Ambulatory Visit: Payer: Self-pay | Admitting: Neurology
